# Patient Record
Sex: Female | Born: 1945 | Race: White | Hispanic: No | Marital: Married
Health system: Southern US, Community
[De-identification: ages and names within clinical notes are randomized; demographics above are authoritative.]

## PROBLEM LIST (undated history)

## (undated) HISTORY — PX: ABDOMINAL HYSTERECTOMY: SHX81

## (undated) HISTORY — PX: LAPAROSCOPIC APPENDECTOMY: SHX408

---

## 2005-03-29 ENCOUNTER — Ambulatory Visit: Payer: Self-pay

## 2006-04-15 ENCOUNTER — Ambulatory Visit: Payer: Self-pay | Admitting: Obstetrics and Gynecology

## 2007-06-15 ENCOUNTER — Ambulatory Visit: Payer: Self-pay

## 2007-08-09 ENCOUNTER — Other Ambulatory Visit: Payer: Self-pay

## 2007-08-09 ENCOUNTER — Ambulatory Visit: Payer: Self-pay | Admitting: Emergency Medicine

## 2008-01-19 ENCOUNTER — Ambulatory Visit: Payer: Self-pay | Admitting: Internal Medicine

## 2008-08-08 ENCOUNTER — Ambulatory Visit: Payer: Self-pay

## 2009-04-02 ENCOUNTER — Ambulatory Visit: Payer: Self-pay | Admitting: Internal Medicine

## 2009-08-15 ENCOUNTER — Ambulatory Visit: Payer: Self-pay | Admitting: Family Medicine

## 2009-08-29 ENCOUNTER — Ambulatory Visit: Payer: Self-pay | Admitting: Internal Medicine

## 2009-09-12 ENCOUNTER — Ambulatory Visit: Payer: Self-pay | Admitting: Gastroenterology

## 2010-08-21 ENCOUNTER — Ambulatory Visit: Payer: Self-pay | Admitting: Family Medicine

## 2011-12-10 ENCOUNTER — Ambulatory Visit: Payer: Self-pay | Admitting: Family Medicine

## 2013-01-14 ENCOUNTER — Ambulatory Visit: Payer: Self-pay | Admitting: Family Medicine

## 2014-01-21 ENCOUNTER — Ambulatory Visit: Payer: Self-pay | Admitting: Family Medicine

## 2015-01-24 ENCOUNTER — Ambulatory Visit: Payer: Self-pay | Admitting: Family Medicine

## 2015-07-04 ENCOUNTER — Other Ambulatory Visit: Payer: Self-pay | Admitting: Family Medicine

## 2015-07-04 DIAGNOSIS — M8580 Other specified disorders of bone density and structure, unspecified site: Secondary | ICD-10-CM

## 2016-03-19 ENCOUNTER — Other Ambulatory Visit: Payer: Self-pay | Admitting: Family Medicine

## 2016-03-19 DIAGNOSIS — Z1231 Encounter for screening mammogram for malignant neoplasm of breast: Secondary | ICD-10-CM

## 2016-03-21 ENCOUNTER — Ambulatory Visit
Admission: RE | Admit: 2016-03-21 | Discharge: 2016-03-21 | Disposition: A | Discharge status: Home / Self Care | Payer: Medicare HMO | Source: Ambulatory Visit | Attending: Family Medicine | Admitting: Family Medicine

## 2016-03-21 DIAGNOSIS — M8580 Other specified disorders of bone density and structure, unspecified site: Secondary | ICD-10-CM

## 2016-03-21 DIAGNOSIS — Z1231 Encounter for screening mammogram for malignant neoplasm of breast: Secondary | ICD-10-CM | POA: Insufficient documentation

## 2016-03-21 DIAGNOSIS — M85852 Other specified disorders of bone density and structure, left thigh: Secondary | ICD-10-CM | POA: Diagnosis not present

## 2017-02-14 ENCOUNTER — Other Ambulatory Visit: Payer: Self-pay | Admitting: Family Medicine

## 2017-02-14 DIAGNOSIS — Z1231 Encounter for screening mammogram for malignant neoplasm of breast: Secondary | ICD-10-CM

## 2017-03-25 ENCOUNTER — Ambulatory Visit
Admission: RE | Admit: 2017-03-25 | Discharge: 2017-03-25 | Disposition: A | Discharge status: Home / Self Care | Payer: Medicare HMO | Source: Ambulatory Visit | Attending: Family Medicine | Admitting: Family Medicine

## 2017-03-25 DIAGNOSIS — Z1231 Encounter for screening mammogram for malignant neoplasm of breast: Secondary | ICD-10-CM | POA: Diagnosis present

## 2018-09-30 ENCOUNTER — Other Ambulatory Visit: Payer: Self-pay | Admitting: Family Medicine

## 2018-09-30 DIAGNOSIS — Z1231 Encounter for screening mammogram for malignant neoplasm of breast: Secondary | ICD-10-CM

## 2018-10-07 ENCOUNTER — Ambulatory Visit
Admission: RE | Admit: 2018-10-07 | Discharge: 2018-10-07 | Disposition: A | Discharge status: Home / Self Care | Payer: Medicare HMO | Source: Ambulatory Visit | Attending: Family Medicine | Admitting: Family Medicine

## 2018-10-07 DIAGNOSIS — Z1231 Encounter for screening mammogram for malignant neoplasm of breast: Secondary | ICD-10-CM | POA: Diagnosis not present

## 2019-08-09 ENCOUNTER — Other Ambulatory Visit: Payer: Self-pay | Admitting: Family Medicine

## 2019-08-09 DIAGNOSIS — Z1231 Encounter for screening mammogram for malignant neoplasm of breast: Secondary | ICD-10-CM

## 2019-09-29 ENCOUNTER — Other Ambulatory Visit: Payer: Self-pay | Admitting: Family Medicine

## 2019-09-29 DIAGNOSIS — M8580 Other specified disorders of bone density and structure, unspecified site: Secondary | ICD-10-CM

## 2019-10-11 ENCOUNTER — Ambulatory Visit: Payer: Medicare HMO

## 2019-11-09 ENCOUNTER — Other Ambulatory Visit: Payer: Self-pay

## 2019-11-09 ENCOUNTER — Ambulatory Visit
Admission: RE | Admit: 2019-11-09 | Discharge: 2019-11-09 | Disposition: A | Discharge status: Home / Self Care | Payer: Medicare HMO | Source: Ambulatory Visit | Attending: Family Medicine | Admitting: Family Medicine

## 2019-11-09 ENCOUNTER — Encounter (INDEPENDENT_AMBULATORY_CARE_PROVIDER_SITE_OTHER): Payer: Self-pay

## 2019-11-09 DIAGNOSIS — Z13820 Encounter for screening for osteoporosis: Secondary | ICD-10-CM | POA: Diagnosis present

## 2019-11-09 DIAGNOSIS — Z1231 Encounter for screening mammogram for malignant neoplasm of breast: Secondary | ICD-10-CM | POA: Insufficient documentation

## 2019-11-09 DIAGNOSIS — Z780 Asymptomatic menopausal state: Secondary | ICD-10-CM | POA: Insufficient documentation

## 2019-11-09 DIAGNOSIS — M8580 Other specified disorders of bone density and structure, unspecified site: Secondary | ICD-10-CM | POA: Insufficient documentation

## 2019-11-10 ENCOUNTER — Other Ambulatory Visit: Payer: Self-pay | Admitting: Family Medicine

## 2019-11-10 DIAGNOSIS — R928 Other abnormal and inconclusive findings on diagnostic imaging of breast: Secondary | ICD-10-CM

## 2019-11-10 DIAGNOSIS — R921 Mammographic calcification found on diagnostic imaging of breast: Secondary | ICD-10-CM

## 2019-11-16 ENCOUNTER — Ambulatory Visit
Admission: RE | Admit: 2019-11-16 | Discharge: 2019-11-16 | Disposition: A | Discharge status: Home / Self Care | Payer: Medicare HMO | Source: Ambulatory Visit | Attending: Family Medicine | Admitting: Family Medicine

## 2019-11-16 DIAGNOSIS — R928 Other abnormal and inconclusive findings on diagnostic imaging of breast: Secondary | ICD-10-CM

## 2019-11-16 DIAGNOSIS — R921 Mammographic calcification found on diagnostic imaging of breast: Secondary | ICD-10-CM | POA: Diagnosis present

## 2019-11-23 ENCOUNTER — Other Ambulatory Visit: Payer: Self-pay | Admitting: Family Medicine

## 2019-11-23 DIAGNOSIS — R928 Other abnormal and inconclusive findings on diagnostic imaging of breast: Secondary | ICD-10-CM

## 2019-11-23 DIAGNOSIS — R921 Mammographic calcification found on diagnostic imaging of breast: Secondary | ICD-10-CM

## 2019-11-25 ENCOUNTER — Ambulatory Visit
Admission: RE | Admit: 2019-11-25 | Discharge: 2019-11-25 | Disposition: A | Discharge status: Home / Self Care | Payer: Medicare HMO | Source: Ambulatory Visit | Attending: Family Medicine | Admitting: Family Medicine

## 2019-11-25 DIAGNOSIS — R928 Other abnormal and inconclusive findings on diagnostic imaging of breast: Secondary | ICD-10-CM

## 2019-11-25 DIAGNOSIS — R921 Mammographic calcification found on diagnostic imaging of breast: Secondary | ICD-10-CM | POA: Diagnosis present

## 2019-11-25 HISTORY — PX: BREAST BIOPSY: SHX20

## 2019-11-26 LAB — SURGICAL PATHOLOGY

## 2021-03-19 ENCOUNTER — Other Ambulatory Visit: Payer: Self-pay | Admitting: Family Medicine

## 2021-03-19 DIAGNOSIS — Z1231 Encounter for screening mammogram for malignant neoplasm of breast: Secondary | ICD-10-CM

## 2021-03-28 ENCOUNTER — Other Ambulatory Visit: Payer: Self-pay

## 2021-03-28 ENCOUNTER — Ambulatory Visit
Admission: RE | Admit: 2021-03-28 | Discharge: 2021-03-28 | Disposition: A | Discharge status: Home / Self Care | Payer: Medicare HMO | Source: Ambulatory Visit | Attending: Family Medicine | Admitting: Family Medicine

## 2021-03-28 DIAGNOSIS — Z1231 Encounter for screening mammogram for malignant neoplasm of breast: Secondary | ICD-10-CM | POA: Insufficient documentation

## 2021-04-24 ENCOUNTER — Ambulatory Visit: Payer: Medicare HMO | Attending: Family Medicine | Admitting: Physical Therapy

## 2021-04-24 ENCOUNTER — Other Ambulatory Visit: Payer: Self-pay

## 2021-04-24 ENCOUNTER — Encounter: Payer: Self-pay | Admitting: Physical Therapy

## 2021-04-24 DIAGNOSIS — M6281 Muscle weakness (generalized): Secondary | ICD-10-CM | POA: Insufficient documentation

## 2021-04-24 DIAGNOSIS — R278 Other lack of coordination: Secondary | ICD-10-CM | POA: Diagnosis present

## 2021-04-24 NOTE — Therapy (Signed)
[facility] [address] [place], [place], [address] Phone: [phone]   Fax:  [phone]  Physical Therapy Evaluation  Patient Details  Name: Tammie Phillips MRN: [id number] Date of Birth: 08/21/1946 Referring Provider (PT): Gayland Curry   Encounter Date: 04/24/2021   PT End of Session - 04/24/21 1821    Visit Number 1    Number of Visits 12    Date for PT Re-Evaluation 07/17/21    Authorization Type IE 04/24/2021    PT Start Time 0800    PT Stop Time 0845    PT Time Calculation (min) 45 min    Activity Tolerance Patient tolerated treatment well    Behavior During Therapy [facility] for tasks assessed/performed           History reviewed. No pertinent past medical history.  Past Surgical History:  Procedure Laterality Date  . ABDOMINAL HYSTERECTOMY     partial  . BREAST BIOPSY Right 11/25/2019   calcs, x marker,neg  . BREAST BIOPSY Right 11/25/2019   calcs, ribbon marker, neg  . LAPAROSCOPIC APPENDECTOMY      There were no vitals filed for this visit.        [facility] PT Assessment - 04/24/21 0001      Assessment   Medical Diagnosis pelvic floor weakness    Referring Provider (PT) Gayland Curry    Hand Dominance Right    Prior Therapy None for this dx      Balance Screen   Has the patient fallen in the past 6 months No           PELVIC HEALTH PHYSICAL THERAPY EVALUATION  SCREENING Red Flags: None Have you had any night sweats? Unexplained weight loss? Saddle anesthesia? Unexplained changes in bowel or bladder habits?  Precautions: Osteopenia   SUBJECTIVE  Chief Complaint: Patient reports that about a year ago she was placed on calcium for osteopenia which caused a lot of disruption to GI system. Patient has leakage at this time. Patient has family history of colon cancer and follows with GI; GI told patient about weak anal muscles. Patient notes that she does have internal and external  hemmorhoids which contribute to her concerns for accidental leakage as she deals not only with stool but also with blood. 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   Patient denies bladder leakage; patient notes that she feels tissue at the entrance when performing hygiene. 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  Pertinent History:  Falls Negative.  Scoliosis Negative. Pulmonary disease/dysfunction Negative. Surgical history: Positive for see above.   Recent Procedures/Tests/Findings: None  Obstetrical History: G3P1 Deliveries: vaginal Tearing/Episiotomy: unsure; no recollection Birthing position: back  Gynecological History: Hysterectomy: Yes Abdominal Pelvic Organ Prolapse: Positive for suspicious. Pain with exam: No Heaviness/pressure: No  Urinary History: Incontinence: Positive for history. Onset: during working years. Triggers: jumping. Amount: Min/Mod.  Protective undergarments: No  Fluid Intake: 60 oz H20, 2 cups coffee; 1 cup tea caffeinated Nocturia: 0x/night Frequency of urination: every 2-4 hours Pain with urination: Negative Difficulty initiating urination: Negative Intermittent stream: Positive for rarely Frequent UTI: Positive for historically.   Gastrointestinal History: [place] Stool Chart: Type 1 (20%), Type 3-4 (75%), Type 6-7 (5%) Frequency of BMs: largely variable; sometimes every third day; sometimes multiple times/day Pain with defecation: Positive for hemorrhoidal pain. Straining with defecation: Negative Hemorrhoids:  Positive ; internal and external; active Toileting posture: not currently using  Incontinence: Positive for gas, stool, mucous. Onset: 1-2 years Triggers: standing, sitting, post-defecation. Amount: Min/Mod.   Sexual activity/pain: Pain with intercourse: Negative.   Initial penetration:  No  Deep thrusting: No External stimulation: No   Location of pain: LLQ (relieved by emptying bowels) Current pain:  0/10  Max pain:  5/10 Least pain:  0/10 Pain quality: pain quality: sharp Radiating pain: No    Patient assessment of present state: "weak muscles"  Current activities:  Golfing, hiking, tai chi  Patient Goals:  "Build up pelvic floor strength and see if this can be helped"  OBJECTIVE  Mental Status Patient is oriented to person, place and time.  Recent memory is intact.  Remote memory is intact.  Attention span and concentration are intact.  Expressive speech is intact.  Patient's fund of knowledge is within normal limits for educational level.  POSTURE/OBSERVATIONS:  Lumbar lordosis: appearing WNL Thoracic kyphosis: mildly increased in angle and length Iliac crest height: appearing equal bilaterally Pelvic obliquity: appearing negative Leg length discrepancy: appearing negative  GAIT: Grossly WFL  RANGE OF MOTION: deferred 2/2 to time constraints   LEFT RIGHT  Lumbar forward flexion (65):      Lumbar extension (30):     Lumbar lateral flexion (25):     Thoracic and Lumbar rotation (30 degrees):       Hip Flexion (0-125):      Hip IR (0-45):     Hip ER (0-45):     Hip Abduction (0-40):     Hip extension (0-15):       STRENGTH: MMT deferred 2/2 to time constraints  RLE LLE  Hip Flexion    Hip Extension    Hip Abduction     Hip Adduction     Hip ER     Hip IR     Knee Extension    Knee Flexion    Dorsiflexion     Plantarflexion (seated)     ABDOMINAL: deferred 2/2 to time constraints Palpation: Diastasis: Scar mobility: Rib flare:  SPECIAL TESTS: deferred 2/2 to time constraints  PHYSICAL PERFORMANCE MEASURES: STS: WFL  EXTERNAL PELVIC EXAM: deferred 2/2 to time constraints Palpation: Breath coordination: Cued Lengthen: Cued Contraction: Cough:  INTERNAL VAGINAL EXAM: deferred 2/2 to time constraints Introitus Appears:   Skin integrity:  Scar mobility: Strength (PERF):  Symmetry: Palpation: Prolapse:   INTERNAL RECTAL EXAM: deferred 2/2 to time constraints Strength (PERF): Symmetry: Palpation: Prolapse:   OUTCOME MEASURES: FOTO (Bowel Leakage 65)   ASSESSMENT Patient is a 75 year old presenting to clinic with chief complaints of accidental bowel leakage and heaviness/pressure in the vagina. Evaluation today is suggestive of deficits in PFM strength, PFM coordination, and IAP management as evidenced by LLQ pain 5/10 with presence of stool, report of abnormal tissue at vaginal opening, gas/stool/mucous leakage with transfers and increased activity. Patient's responses on FOTO outcome measures (57) indicate significant functional limitations/disability/distress. Patient's progress may be limited due to age-related sarcopenia impacts on PFM; however, patient's motivation is advantageous. Patient was able to achieve basic understanding of PFM functions during today's evaluation and responded positively to educational interventions. Patient will benefit from continued skilled therapeutic intervention to address deficits in PFM strength, PFM coordination, and IAP management in order to increase function and improve overall QOL.  EDUCATION Patient educated on prognosis, POC, and provided with HEP including: bowel diary, toileting posture. Patient articulated understanding and returned demonstration. Patient will benefit  from further education in order to maximize compliance and understanding for long-term therapeutic gains.      Objective measurements completed on examination: See above findings.       PT Long Term Goals - 04/24/21 1840      PT LONG TERM GOAL #1   Title Patient will demonstrate independence with HEP in order to maximize therapeutic gains and improve carryover from physical therapy sessions to ADLs in the home and community.    Baseline IE: not initiated    Time 12    Period Weeks     Status New    Target Date 07/17/21      PT LONG TERM GOAL #2   Title Patient will report BMs classified as Type 3-Type 4 on the [facility] Stool Chart greater than 90% of the time to demonstrate improved motility and stool bulking in order to decrease fecal distress and improve overall QOL.    Baseline IE: Type 3-4: 75%    Time 12    Period Weeks    Status New    Target Date 07/17/21      PT LONG TERM GOAL #3   Title Patient will demonstrate improved function as evidenced by a score of 68 on FOTO measure for full participation in activities at home and in the community.    Baseline IE: 31    Time 12    Period Weeks    Status New    Target Date 07/17/21      PT LONG TERM GOAL #4   Title Patient will be able to articulate and demonstrate appropriate body mechanics for management of intra-abdominal pressure as well as bone density precautions for improved function/participation at home and in the community.    Baseline IE: not demonstrated    Time 12    Period Weeks    Status New    Target Date 07/17/21      PT LONG TERM GOAL #5   Title Patient will demonstrate circumferential and sequential contraction of >4/5 MMT, > 5 sec hold x5 and 5 consecutive quick flicks with </= 10 min rest between testing bouts, and relaxation of the PFM coordinated with breath for improved management of intra-abdominal pressure and normal bowel and bladder function without the presence of pain nor incontinence in order to improve participation at home and in the community.    Baseline IE: to be assessed at next visit    Time 12    Period Weeks    Status New    Target Date 07/17/21      Additional Long Term Goals   Additional Long Term Goals Yes      PT LONG TERM GOAL #6   Title Patient will demonstrate coordinated lengthening and relaxation of PFM with diaphragmatic inhalation in order to decrease spasm and allow for unrestricted elimination of urine/feces for improved overall QOL.    Baseline IE: to be  assessed at next visit    Time 12    Period Weeks    Status New    Target Date 07/17/21                  Plan - 04/24/21 1822    Clinical Impression Statement Patient is a 75 year old presenting to clinic with chief complaints of accidental bowel leakage and heaviness/pressure in the vagina. Evaluation today is suggestive of deficits in PFM strength, PFM coordination, and IAP management as evidenced by LLQ pain 5/10 with presence of stool, report of  abnormal tissue at vaginal opening, gas/stool/mucous leakage with transfers and increased activity. Patient's responses on FOTO outcome measures (57) indicate significant functional limitations/disability/distress. Patient's progress may be limited due to age-related sarcopenia impacts on PFM; however, patient's motivation is advantageous. Patient was able to achieve basic understanding of PFM functions during today's evaluation and responded positively to educational interventions. Patient will benefit from continued skilled therapeutic intervention to address deficits in PFM strength, PFM coordination, and IAP management in order to increase function and improve overall QOL.    Personal Factors and Comorbidities Comorbidity 3+;Past/Current Experience;Time since onset of injury/illness/exacerbation;Behavior Pattern;Age    Comorbidities hyperlipidemia, CAD, HTN, atherosclerosis of abdominal aorta    Examination-Activity Limitations Transfers;Continence;Squat;Lift;Bend;Stand    Examination-Participation Restrictions Community Activity;Shop;Cleaning;Laundry;Yard Work;Driving    Stability/Clinical Decision Making Evolving/Moderate complexity    Clinical Decision Making Moderate    Rehab Potential Fair    PT Frequency 1x / week    PT Duration 12 weeks    PT Treatment/Interventions ADLs/Self Care Home Management;Biofeedback;Cryotherapy;Electrical Stimulation;Moist Heat;Therapeutic exercise;Neuromuscular re-education;Patient/family education;Manual  techniques;Scar mobilization;Dry needling;Taping    PT Next Visit Plan physical assessment    PT Home Exercise Plan bowel diary    Consulted and Agree with Plan of Care Patient           Patient will benefit from skilled therapeutic intervention in order to improve the following deficits and impairments:  Improper body mechanics,Decreased coordination,Decreased activity tolerance,Decreased strength  Visit Diagnosis: Other lack of coordination  Muscle weakness (generalized)     Problem List There are no problems to display for this patient.  Myles Gip PT, DPT [phone]  04/24/2021, 6:44 PM  [facility] [address] [place], [place], [address] Phone: [phone]   Fax:  [phone]  Name: Tammie Phillips MRN: [id number] Date of Birth: 06-Jun-1946

## 2021-04-30 ENCOUNTER — Ambulatory Visit: Payer: Medicare HMO | Admitting: Physical Therapy

## 2021-04-30 ENCOUNTER — Encounter: Payer: Self-pay | Admitting: Physical Therapy

## 2021-04-30 ENCOUNTER — Other Ambulatory Visit: Payer: Self-pay

## 2021-04-30 DIAGNOSIS — R278 Other lack of coordination: Secondary | ICD-10-CM | POA: Diagnosis not present

## 2021-04-30 DIAGNOSIS — M6281 Muscle weakness (generalized): Secondary | ICD-10-CM

## 2021-04-30 NOTE — Therapy (Signed)
[place] [facility] [address] [place], [place], [address] Phone: [phone]   Fax:  [phone]  Physical Therapy Treatment  Patient Details  Name: Tammie Phillips MRN: [id number] Date of Birth: [birth year] Referring Provider (PT): Leim Fabry   Encounter Date: 04/30/2021   PT End of Session - 04/30/21 0836    Visit Number 2    Number of Visits 12    Date for PT Re-Evaluation 07/17/21    Authorization Type IE 04/24/2021    PT Start Time 0840    PT Stop Time 0925    PT Time Calculation (min) 45 min    Activity Tolerance Patient tolerated treatment well    Behavior During Therapy [facility] for tasks assessed/performed           History reviewed. No pertinent past medical history.  Past Surgical History:  Procedure Laterality Date  . ABDOMINAL HYSTERECTOMY     partial  . BREAST BIOPSY Right 11/25/2019   calcs, x marker,neg  . BREAST BIOPSY Right 11/25/2019   calcs, ribbon marker, neg  . LAPAROSCOPIC APPENDECTOMY      There were no vitals filed for this visit.   Subjective Assessment - 04/30/21 0841    Subjective Patient notes that nothing much has changed. Patient reports that she had a pretty good week with respect to BMs. States that she had one episode of smearing and one episode of accidental bowel leakage.    Currently in Pain? No/denies          TREATMENT  Pre-treatment assessment: RANGE OF MOTION:    LEFT RIGHT  Lumbar forward flexion (65):  WFL    Lumbar extension (30): WFL    Lumbar lateral flexion (25):  [facility]  Thoracic and Lumbar rotation (30 degrees):    [facility] WFL  Hip Flexion (0-125):   WNL WNL  Hip IR (0-45):  WNL WNL  Hip ER (0-45):  WNL WNL  Hip Abduction (0-40):  WNL WNL  Hip extension (0-15):  WNL WNL    STRENGTH: MMT   RLE LLE  Hip Flexion 4 3+*  Hip Extension 5 5  Hip Abduction  5 5  Hip Adduction  5 5  Hip ER  4 3+  Hip IR  5 5  Knee Extension 5 5  Knee Flexion 5 5  Dorsiflexion   5 5  Plantarflexion (seated) 5 5   ABDOMINAL:  Palpation: TTP over midline in UQ Diastasis: none present Scar mobility: n/a Rib flare: B, L>R  SPECIAL TESTS: SLR (SN 92, -LR 0.29): R: Negative L:  Negative FABER (SN 81): R: Negative L: Negative FADIR (SN 94): R: Negative L: Negative Hip scour (SN 50): R: Negative L: Negative   EXTERNAL PELVIC EXAM: Patient educated on the purpose of the pelvic exam and articulated understanding; patient consented to the exam verbally. Palpation: no TTP Breath coordination: minimally present Cued Lengthen: able to coordinate with eccentric abdominal synergy Cued Contraction: significant contributions from abdominals, gluteals and adductors Cough: paradoxical  Neuromuscular Re-education: Supine hooklying diaphragmatic breathing with VCs and TCs for downregulation of the nervous system and improved management of IAP Supine hooklying, PFM lengthening with inhalation. VCs and TCs to decrease compensatory patterns and encourage optimal relaxation of the PFM. Patient education on genital hygiene in the presence of bowel leakage, provided with handouts.   Patient educated throughout session on appropriate technique and form using multi-modal cueing, HEP, and activity modification. Patient articulated understanding and returned demonstration.  Patient  Response to interventions: Comfortable to return in 2 weeks for PFM basics and bowel function education  ASSESSMENT Patient presents to clinic with excellent motivation to participate in therapy. Patient demonstrates deficits in PFM strength, PFM coordination, and IAP management. Patient able to achieve PFM lengthening with coordinated breath during today's session and responded positively to neuromuscular re-education interventions. Patient will benefit from continued skilled therapeutic intervention to address remaining deficits in PFM strength, PFM coordination, and IAP management in order to increase  function and improve overall QOL.     PT Long Term Goals - 04/24/21 1840      PT LONG TERM GOAL #1   Title Patient will demonstrate independence with HEP in order to maximize therapeutic gains and improve carryover from physical therapy sessions to ADLs in the home and community.    Baseline IE: not initiated    Time 12    Period Weeks    Status New    Target Date 07/17/21      PT LONG TERM GOAL #2   Title Patient will report BMs classified as Type 3-Type 4 on the [facility] Stool Chart greater than 90% of the time to demonstrate improved motility and stool bulking in order to decrease fecal distress and improve overall QOL.    Baseline IE: Type 3-4: 75%    Time 12    Period Weeks    Status New    Target Date 07/17/21      PT LONG TERM GOAL #3   Title Patient will demonstrate improved function as evidenced by a score of 68 on FOTO measure for full participation in activities at home and in the community.    Baseline IE: 57    Time 12    Period Weeks    Status New    Target Date 07/17/21      PT LONG TERM GOAL #4   Title Patient will be able to articulate and demonstrate appropriate body mechanics for management of intra-abdominal pressure as well as bone density precautions for improved function/participation at home and in the community.    Baseline IE: not demonstrated    Time 12    Period Weeks    Status New    Target Date 07/17/21      PT LONG TERM GOAL #5   Title Patient will demonstrate circumferential and sequential contraction of >4/5 MMT, > 5 sec hold x5 and 5 consecutive quick flicks with </= 10 min rest between testing bouts, and relaxation of the PFM coordinated with breath for improved management of intra-abdominal pressure and normal bowel and bladder function without the presence of pain nor incontinence in order to improve participation at home and in the community.    Baseline IE: to be assessed at next visit    Time 12    Period Weeks    Status New    Target  Date 07/17/21      Additional Long Term Goals   Additional Long Term Goals Yes      PT LONG TERM GOAL #6   Title Patient will demonstrate coordinated lengthening and relaxation of PFM with diaphragmatic inhalation in order to decrease spasm and allow for unrestricted elimination of urine/feces for improved overall QOL.    Baseline IE: to be assessed at next visit    Time 12    Period Weeks    Status New    Target Date 07/17/21  Plan - 04/30/21 0837    Clinical Impression Statement Patient presents to clinic with excellent motivation to participate in therapy. Patient demonstrates deficits in PFM strength, PFM coordination, and IAP management. Patient able to achieve PFM lengthening with coordinated breath during today's session and responded positively to neuromuscular re-education interventions. Patient will benefit from continued skilled therapeutic intervention to address remaining deficits in PFM strength, PFM coordination, and IAP management in order to increase function and improve overall QOL.    Personal Factors and Comorbidities Comorbidity 3+;Past/Current Experience;Time since onset of injury/illness/exacerbation;Behavior Pattern;Age    Comorbidities hyperlipidemia, CAD, HTN, atherosclerosis of abdominal aorta    Examination-Activity Limitations Transfers;Continence;Squat;Lift;Bend;Stand    Examination-Participation Restrictions Community Activity;Shop;Cleaning;Laundry;Yard Work;Driving    Stability/Clinical Decision Making Evolving/Moderate complexity    Rehab Potential Fair    PT Frequency 1x / week    PT Duration 12 weeks    PT Treatment/Interventions ADLs/Self Care Home Management;Biofeedback;Cryotherapy;Electrical Stimulation;Moist Heat;Therapeutic exercise;Neuromuscular re-education;Patient/family education;Manual techniques;Scar mobilization;Dry needling;Taping    PT Next Visit Plan defecation basics    PT Home Exercise Plan bowel diary    Consulted  and Agree with Plan of Care Patient           Patient will benefit from skilled therapeutic intervention in order to improve the following deficits and impairments:  Improper body mechanics,Decreased coordination,Decreased activity tolerance,Decreased strength  Visit Diagnosis: Other lack of coordination  Muscle weakness (generalized)     Problem List There are no problems to display for this patient.  Sheria Lang PT, DPT [phone]  04/30/2021, 9:41 AM  [facility] [address] [place], [place], [address] Phone: [phone]   Fax:  [phone]  Name: Tammie Phillips MRN: [id number] Date of Birth: 1946/03/25

## 2021-05-01 ENCOUNTER — Ambulatory Visit: Payer: Medicare HMO | Admitting: Physical Therapy

## 2021-05-14 ENCOUNTER — Other Ambulatory Visit: Payer: Self-pay

## 2021-05-14 ENCOUNTER — Ambulatory Visit: Payer: Medicare HMO | Admitting: Physical Therapy

## 2021-05-14 ENCOUNTER — Encounter: Payer: Self-pay | Admitting: Physical Therapy

## 2021-05-14 DIAGNOSIS — R278 Other lack of coordination: Secondary | ICD-10-CM

## 2021-05-14 DIAGNOSIS — M6281 Muscle weakness (generalized): Secondary | ICD-10-CM

## 2021-05-14 NOTE — Therapy (Signed)
[facility] [address] [place], [place], [address] Phone: [phone]   Fax:  [phone]  Physical Therapy Treatment  Patient Details  Name: Tammie Phillips MRN: [id number] Date of Birth: [birth year] Referring Provider (PT): Leim Fabry   Encounter Date: 05/14/2021   PT End of Session - 05/14/21 0811    Visit Number 3    Number of Visits 12    Date for PT Re-Evaluation 07/17/21    Authorization Type IE 04/24/2021    PT Start Time 0800    PT Stop Time 0840    PT Time Calculation (min) 40 min    Activity Tolerance Patient tolerated treatment well    Behavior During Therapy [facility] for tasks assessed/performed           History reviewed. No pertinent past medical history.  Past Surgical History:  Procedure Laterality Date  . ABDOMINAL HYSTERECTOMY     partial  . BREAST BIOPSY Right 11/25/2019   calcs, x marker,neg  . BREAST BIOPSY Right 11/25/2019   calcs, ribbon marker, neg  . LAPAROSCOPIC APPENDECTOMY      There were no vitals filed for this visit.   Subjective Assessment - 05/14/21 0802    Subjective Patient reports that after last session, she had a good week with minimal leakage. Patient did go to the beach for a week and had some changes in diet as a result. Patient had diarrhea and had minimal leakage; patient then had complete loss of bowel. Patient also had loss of bowel when leaving the house.    Currently in Pain? No/denies             TREATMENT Manual Therapy: Colon massage for improved regulation of bowel and decreased pain over LLQ. Patient articulated understanding and demonstrated technique. MFR of LLQ with superior medial mobilization of sigmoid colon for improved bowel emptying and pain management.   Neuromuscular Re-education: Supine hooklying diaphragmatic breathing with VCs and TCs for downregulation of the nervous system and improved management of IAP Patient educated on typical bowel  function, factors impacting bowel function including: dietary and hormonal. Standing hip flexor stretch for decreased pain and tension in LQ.  Patient educated throughout session on appropriate technique and form using multi-modal cueing, HEP, and activity modification. Patient articulated understanding and returned demonstration.  Patient Response to interventions: Comfortable to apply stretch and massage techniques at home.  ASSESSMENT Patient presents to clinic with excellent motivation to participate in therapy. Patient demonstrates deficits in PFM strength, PFM coordination, and IAP management. Patient had initial LLQ tenderness which improved with manual techniques during today's session and responded positively to neuromuscular re-education interventions. Patient will benefit from continued skilled therapeutic intervention to address remaining deficits in PFM strength, PFM coordination, and IAP management in order to increase function and improve overall QOL.     PT Long Term Goals - 04/24/21 1840      PT LONG TERM GOAL #1   Title Patient will demonstrate independence with HEP in order to maximize therapeutic gains and improve carryover from physical therapy sessions to ADLs in the home and community.    Baseline IE: not initiated    Time 12    Period Weeks    Status New    Target Date 07/17/21      PT LONG TERM GOAL #2   Title Patient will report BMs classified as Type 3-Type 4 on the [facility] Stool Chart greater than 90% of the time to demonstrate improved  motility and stool bulking in order to decrease fecal distress and improve overall QOL.    Baseline IE: Type 3-4: 75%    Time 12    Period Weeks    Status New    Target Date 07/17/21      PT LONG TERM GOAL #3   Title Patient will demonstrate improved function as evidenced by a score of 68 on FOTO measure for full participation in activities at home and in the community.    Baseline IE: 57    Time 12    Period Weeks     Status New    Target Date 07/17/21      PT LONG TERM GOAL #4   Title Patient will be able to articulate and demonstrate appropriate body mechanics for management of intra-abdominal pressure as well as bone density precautions for improved function/participation at home and in the community.    Baseline IE: not demonstrated    Time 12    Period Weeks    Status New    Target Date 07/17/21      PT LONG TERM GOAL #5   Title Patient will demonstrate circumferential and sequential contraction of >4/5 MMT, > 5 sec hold x5 and 5 consecutive quick flicks with </= 10 min rest between testing bouts, and relaxation of the PFM coordinated with breath for improved management of intra-abdominal pressure and normal bowel and bladder function without the presence of pain nor incontinence in order to improve participation at home and in the community.    Baseline IE: to be assessed at next visit    Time 12    Period Weeks    Status New    Target Date 07/17/21      Additional Long Term Goals   Additional Long Term Goals Yes      PT LONG TERM GOAL #6   Title Patient will demonstrate coordinated lengthening and relaxation of PFM with diaphragmatic inhalation in order to decrease spasm and allow for unrestricted elimination of urine/feces for improved overall QOL.    Baseline IE: to be assessed at next visit    Time 12    Period Weeks    Status New    Target Date 07/17/21                 Plan - 05/14/21 5625    Clinical Impression Statement Patient presents to clinic with excellent motivation to participate in therapy. Patient demonstrates deficits in PFM strength, PFM coordination, and IAP management. Patient had initial LLQ tenderness which improved with manual techniques during today's session and responded positively to neuromuscular re-education interventions. Patient will benefit from continued skilled therapeutic intervention to address remaining deficits in PFM strength, PFM coordination,  and IAP management in order to increase function and improve overall QOL.    Personal Factors and Comorbidities Comorbidity 3+;Past/Current Experience;Time since onset of injury/illness/exacerbation;Behavior Pattern;Age    Comorbidities hyperlipidemia, CAD, HTN, atherosclerosis of abdominal aorta    Examination-Activity Limitations Transfers;Continence;Squat;Lift;Bend;Stand    Examination-Participation Restrictions Community Activity;Shop;Cleaning;Laundry;Yard Work;Driving    Stability/Clinical Decision Making Evolving/Moderate complexity    Rehab Potential Fair    PT Frequency 1x / week    PT Duration 12 weeks    PT Treatment/Interventions ADLs/Self Care Home Management;Biofeedback;Cryotherapy;Electrical Stimulation;Moist Heat;Therapeutic exercise;Neuromuscular re-education;Patient/family education;Manual techniques;Scar mobilization;Dry needling;Taping    PT Next Visit Plan sacral mobilizations; PFM strengthening, diaphragmatic release    PT Home Exercise Plan hip flexor stretch, colon massage    Consulted and Agree with Plan of  Care Patient           Patient will benefit from skilled therapeutic intervention in order to improve the following deficits and impairments:  Improper body mechanics,Decreased coordination,Decreased activity tolerance,Decreased strength  Visit Diagnosis: Other lack of coordination  Muscle weakness (generalized)     Problem List There are no problems to display for this patient.  Sheria Lang PT, DPT [phone]  05/14/2021, 9:17 AM  [facility] [address] [place], [place], [address] Phone: [phone]   Fax:  [phone]  Name: Tammie Phillips MRN: [id number] Date of Birth: 09/25/1946

## 2021-05-15 ENCOUNTER — Encounter: Payer: Medicare HMO | Admitting: Physical Therapy

## 2021-05-16 ENCOUNTER — Encounter: Payer: Medicare HMO | Admitting: Physical Therapy

## 2021-05-22 ENCOUNTER — Encounter: Payer: Medicare HMO | Admitting: Physical Therapy

## 2021-05-23 ENCOUNTER — Encounter: Payer: Medicare HMO | Admitting: Physical Therapy

## 2021-05-24 ENCOUNTER — Ambulatory Visit: Payer: Medicare HMO | Attending: Family Medicine | Admitting: Physical Therapy

## 2021-05-24 ENCOUNTER — Encounter: Payer: Self-pay | Admitting: Physical Therapy

## 2021-05-24 ENCOUNTER — Other Ambulatory Visit: Payer: Self-pay

## 2021-05-24 DIAGNOSIS — M6281 Muscle weakness (generalized): Secondary | ICD-10-CM | POA: Insufficient documentation

## 2021-05-24 DIAGNOSIS — R278 Other lack of coordination: Secondary | ICD-10-CM | POA: Diagnosis not present

## 2021-05-24 NOTE — Therapy (Signed)
[facility] [address] [place], [place], [address] Phone: [phone]   Fax:  [phone]  Physical Therapy Treatment  Patient Details  Name: Tammie Phillips MRN: [id number] Date of Birth: 10/11/1946 Referring Provider (PT): Leim Fabry   Encounter Date: 05/24/2021   PT End of Session - 05/24/21 0907    Visit Number 4    Number of Visits 12    Date for PT Re-Evaluation 07/17/21    Authorization Type IE 04/24/2021    PT Start Time 0845    PT Stop Time 0925    PT Time Calculation (min) 40 min    Activity Tolerance Patient tolerated treatment well    Behavior During Therapy [facility] for tasks assessed/performed           History reviewed. No pertinent past medical history.  Past Surgical History:  Procedure Laterality Date  . ABDOMINAL HYSTERECTOMY     partial  . BREAST BIOPSY Right 11/25/2019   calcs, x marker,neg  . BREAST BIOPSY Right 11/25/2019   calcs, ribbon marker, neg  . LAPAROSCOPIC APPENDECTOMY      There were no vitals filed for this visit.   Subjective Assessment - 05/24/21 0847    Subjective Patient notes since last visit, she has had a very calm week with respect to digestive issues. Patient notes that she has gotten her bidet but will be setting it up soon. She has had only 1-2x with some seepage on undergarments. Patient notes yesterday she had some pain in the LLQ but this resolved prior to bed. Patient also reports that she has noticed with first BM in the AM she is having to go within 1-2 hours later.    Currently in Pain? No/denies           TREATMENT Manual Therapy: STM and TPR performed to L gluteal mm and sacral connective tissues to allow for decreased tension and pain and improved posture and function L sacral border mobilizations with movement for decreased spasm and improved mobility, grade II/III   Neuromuscular Re-education: Patient educated on resources for information regarding  GI symptoms and management including: [employer] and [place] Criteria.  Figure 4 stretch, L, for improved pain and lumbopelvic posture  Patient educated throughout session on appropriate technique and form using multi-modal cueing, HEP, and activity modification. Patient articulated understanding and returned demonstration.  Patient Response to interventions: Comfortable to return in 1 week  ASSESSMENT Patient presents to clinic with excellent motivation to participate in therapy. Patient demonstrates deficits in PFM strength, PFM coordination, and IAP management. Patient with significant hypomobility at L sacral border and L sacral ligaments during today's session and responded positively to manual interventions. Patient will benefit from continued skilled therapeutic intervention to address remaining deficits in PFM strength, PFM coordination, and IAP management in order to increase function and improve overall QOL.    PT Long Term Goals - 04/24/21 1840      PT LONG TERM GOAL #1   Title Patient will demonstrate independence with HEP in order to maximize therapeutic gains and improve carryover from physical therapy sessions to ADLs in the home and community.    Baseline IE: not initiated    Time 12    Period Weeks    Status New    Target Date 07/17/21      PT LONG TERM GOAL #2   Title Patient will report BMs classified as Type 3-Type 4 on the [facility] Stool Chart greater than 90%  of the time to demonstrate improved motility and stool bulking in order to decrease fecal distress and improve overall QOL.    Baseline IE: Type 3-4: 75%    Time 12    Period Weeks    Status New    Target Date 07/17/21      PT LONG TERM GOAL #3   Title Patient will demonstrate improved function as evidenced by a score of 68 on FOTO measure for full participation in activities at home and in the community.    Baseline IE: 57    Time 12    Period Weeks    Status New    Target Date 07/17/21      PT  LONG TERM GOAL #4   Title Patient will be able to articulate and demonstrate appropriate body mechanics for management of intra-abdominal pressure as well as bone density precautions for improved function/participation at home and in the community.    Baseline IE: not demonstrated    Time 12    Period Weeks    Status New    Target Date 07/17/21      PT LONG TERM GOAL #5   Title Patient will demonstrate circumferential and sequential contraction of >4/5 MMT, > 5 sec hold x5 and 5 consecutive quick flicks with </= 10 min rest between testing bouts, and relaxation of the PFM coordinated with breath for improved management of intra-abdominal pressure and normal bowel and bladder function without the presence of pain nor incontinence in order to improve participation at home and in the community.    Baseline IE: to be assessed at next visit    Time 12    Period Weeks    Status New    Target Date 07/17/21      Additional Long Term Goals   Additional Long Term Goals Yes      PT LONG TERM GOAL #6   Title Patient will demonstrate coordinated lengthening and relaxation of PFM with diaphragmatic inhalation in order to decrease spasm and allow for unrestricted elimination of urine/feces for improved overall QOL.    Baseline IE: to be assessed at next visit    Time 12    Period Weeks    Status New    Target Date 07/17/21                 Plan - 05/24/21 0907    Clinical Impression Statement Patient presents to clinic with excellent motivation to participate in therapy. Patient demonstrates deficits in PFM strength, PFM coordination, and IAP management. Patient with significant hypomobility at L sacral border and L sacral ligaments during today's session and responded positively to manual interventions. Patient will benefit from continued skilled therapeutic intervention to address remaining deficits in PFM strength, PFM coordination, and IAP management in order to increase function and improve  overall QOL.    Personal Factors and Comorbidities Comorbidity 3+;Past/Current Experience;Time since onset of injury/illness/exacerbation;Behavior Pattern;Age    Comorbidities hyperlipidemia, CAD, HTN, atherosclerosis of abdominal aorta    Examination-Activity Limitations Transfers;Continence;Squat;Lift;Bend;Stand    Examination-Participation Restrictions Community Activity;Shop;Cleaning;Laundry;Yard Work;Driving    Stability/Clinical Decision Making Evolving/Moderate complexity    Rehab Potential Fair    PT Frequency 1x / week    PT Duration 12 weeks    PT Treatment/Interventions ADLs/Self Care Home Management;Biofeedback;Cryotherapy;Electrical Stimulation;Moist Heat;Therapeutic exercise;Neuromuscular re-education;Patient/family education;Manual techniques;Scar mobilization;Dry needling;Taping    PT Next Visit Plan sacral mobilizations; PFM strengthening, diaphragmatic release    PT Home Exercise Plan hip flexor stretch, colon massage  Consulted and Agree with Plan of Care Patient           Patient will benefit from skilled therapeutic intervention in order to improve the following deficits and impairments:  Improper body mechanics,Decreased coordination,Decreased activity tolerance,Decreased strength  Visit Diagnosis: Other lack of coordination  Muscle weakness (generalized)     Problem List There are no problems to display for this patient.  Sheria Lang PT, DPT [phone]  05/24/2021, 1:03 PM  [place] [facility] [address] [place], [place], [address] Phone: [phone]   Fax:  [phone]  Name: Caleen Taaffe MRN: [id number] Date of Birth: 1946/09/14

## 2021-05-28 ENCOUNTER — Encounter: Payer: Self-pay | Admitting: Physical Therapy

## 2021-05-28 ENCOUNTER — Ambulatory Visit: Payer: Medicare HMO | Admitting: Physical Therapy

## 2021-05-28 ENCOUNTER — Other Ambulatory Visit: Payer: Self-pay

## 2021-05-28 DIAGNOSIS — R278 Other lack of coordination: Secondary | ICD-10-CM

## 2021-05-28 DIAGNOSIS — M6281 Muscle weakness (generalized): Secondary | ICD-10-CM

## 2021-05-28 NOTE — Therapy (Signed)
[facility] [facility] [address] [place], [place], [address] Phone: [phone]   Fax:  [phone]  Physical Therapy Treatment  Patient Details  Name: Tammie Phillips MRN: [id number] Date of Birth: [birth year] Referring Provider (PT): Leim Fabry   Encounter Date: 05/28/2021   PT End of Session - 05/28/21 0810    Visit Number 5    Number of Visits 12    Date for PT Re-Evaluation 07/17/21    Authorization Type IE 04/24/2021    PT Start Time 0800    PT Stop Time 0840    PT Time Calculation (min) 40 min    Activity Tolerance Patient tolerated treatment well    Behavior During Therapy [facility] for tasks assessed/performed           History reviewed. No pertinent past medical history.  Past Surgical History:  Procedure Laterality Date  . ABDOMINAL HYSTERECTOMY     partial  . BREAST BIOPSY Right 11/25/2019   calcs, x marker,neg  . BREAST BIOPSY Right 11/25/2019   calcs, ribbon marker, neg  . LAPAROSCOPIC APPENDECTOMY      There were no vitals filed for this visit.   Subjective Assessment - 05/28/21 0807    Subjective Patient notes no changes since last visit. Patient will be installing bidet this week. Patient does note some pain yesterday in colon and has correlated this with garlic consumption.    Currently in Pain? No/denies           TREATMENT Manual Therapy: STM and TPR performed to L gluteal mm and sacral connective tissues to allow for decreased tension and pain and improved posture and function L sacral border mobilizations with movement for decreased spasm and improved mobility, grade II/III Diaphragmatic MFR for improved rib cage mobility and bowel function  Neuromuscular Re-education: Cat/Cow with coordinated breath pattern for improved spinal mobility Supine hooklying diaphragmatic breathing with VCs and TCs for downregulation of the nervous system and improved management of IAP Sidelying, thoracolumbar  rotations (open-book) bilaterally with diaphragmatic breathing for improved diaphragmatic and rib cage excursion. VCs and TCs to prevent compensations. Seated cat/cow with coordinated breath pattern for improved spinal mobility   Patient educated throughout session on appropriate technique and form using multi-modal cueing, HEP, and activity modification. Patient articulated understanding and returned demonstration.  Patient Response to interventions: Comfortable to return in 1 week for PFM strengthening  ASSESSMENT Patient presents to clinic with excellent motivation to participate in therapy. Patient demonstrates deficits in PFM strength, PFM coordination, and IAP management. Patient had significant multijoint compensations with spinal mobility interventions during today's session and responded positively to manual interventions. Patient will benefit from continued skilled therapeutic intervention to address remaining deficits in PFM strength, PFM coordination, and IAP management in order to increase function and improve overall QOL.     PT Long Term Goals - 04/24/21 1840      PT LONG TERM GOAL #1   Title Patient will demonstrate independence with HEP in order to maximize therapeutic gains and improve carryover from physical therapy sessions to ADLs in the home and community.    Baseline IE: not initiated    Time 12    Period Weeks    Status New    Target Date 07/17/21      PT LONG TERM GOAL #2   Title Patient will report BMs classified as Type 3-Type 4 on the [facility] Stool Chart greater than 90% of the time to demonstrate improved motility and stool  bulking in order to decrease fecal distress and improve overall QOL.    Baseline IE: Type 3-4: 75%    Time 12    Period Weeks    Status New    Target Date 07/17/21      PT LONG TERM GOAL #3   Title Patient will demonstrate improved function as evidenced by a score of 68 on FOTO measure for full participation in activities at home and  in the community.    Baseline IE: 57    Time 12    Period Weeks    Status New    Target Date 07/17/21      PT LONG TERM GOAL #4   Title Patient will be able to articulate and demonstrate appropriate body mechanics for management of intra-abdominal pressure as well as bone density precautions for improved function/participation at home and in the community.    Baseline IE: not demonstrated    Time 12    Period Weeks    Status New    Target Date 07/17/21      PT LONG TERM GOAL #5   Title Patient will demonstrate circumferential and sequential contraction of >4/5 MMT, > 5 sec hold x5 and 5 consecutive quick flicks with </= 10 min rest between testing bouts, and relaxation of the PFM coordinated with breath for improved management of intra-abdominal pressure and normal bowel and bladder function without the presence of pain nor incontinence in order to improve participation at home and in the community.    Baseline IE: to be assessed at next visit    Time 12    Period Weeks    Status New    Target Date 07/17/21      Additional Long Term Goals   Additional Long Term Goals Yes      PT LONG TERM GOAL #6   Title Patient will demonstrate coordinated lengthening and relaxation of PFM with diaphragmatic inhalation in order to decrease spasm and allow for unrestricted elimination of urine/feces for improved overall QOL.    Baseline IE: to be assessed at next visit    Time 12    Period Weeks    Status New    Target Date 07/17/21                 Plan - 05/28/21 0810    Clinical Impression Statement Patient presents to clinic with excellent motivation to participate in therapy. Patient demonstrates deficits in PFM strength, PFM coordination, and IAP management. Patient had significant multijoint compensations with spinal mobility interventions during today's session and responded positively to manual interventions. Patient will benefit from continued skilled therapeutic intervention to  address remaining deficits in PFM strength, PFM coordination, and IAP management in order to increase function and improve overall QOL.    Personal Factors and Comorbidities Comorbidity 3+;Past/Current Experience;Time since onset of injury/illness/exacerbation;Behavior Pattern;Age    Comorbidities hyperlipidemia, CAD, HTN, atherosclerosis of abdominal aorta    Examination-Activity Limitations Transfers;Continence;Squat;Lift;Bend;Stand    Examination-Participation Restrictions Community Activity;Shop;Cleaning;Laundry;Yard Work;Driving    Stability/Clinical Decision Making Evolving/Moderate complexity    Rehab Potential Fair    PT Frequency 1x / week    PT Duration 12 weeks    PT Treatment/Interventions ADLs/Self Care Home Management;Biofeedback;Cryotherapy;Electrical Stimulation;Moist Heat;Therapeutic exercise;Neuromuscular re-education;Patient/family education;Manual techniques;Scar mobilization;Dry needling;Taping    PT Next Visit Plan PFM strengthening, diaphragmatic release    PT Home Exercise Plan hip flexor stretch, colon massage    Consulted and Agree with Plan of Care Patient  Patient will benefit from skilled therapeutic intervention in order to improve the following deficits and impairments:  Improper body mechanics,Decreased coordination,Decreased activity tolerance,Decreased strength  Visit Diagnosis: Other lack of coordination  Muscle weakness (generalized)     Problem List There are no problems to display for this patient.  Sheria Lang PT, DPT [phone]  05/28/2021, 9:24 AM  [place] [facility] [address] [place], [place], [address] Phone: [phone]   Fax:  [phone]  Name: Tammie Phillips MRN: [id number] Date of Birth: [birth year]

## 2021-05-29 ENCOUNTER — Encounter: Payer: Medicare HMO | Admitting: Physical Therapy

## 2021-05-30 ENCOUNTER — Encounter: Payer: Medicare HMO | Admitting: Physical Therapy

## 2021-06-04 ENCOUNTER — Other Ambulatory Visit: Payer: Self-pay

## 2021-06-04 ENCOUNTER — Encounter: Payer: Self-pay | Admitting: Physical Therapy

## 2021-06-04 ENCOUNTER — Ambulatory Visit: Payer: Medicare HMO | Admitting: Physical Therapy

## 2021-06-04 DIAGNOSIS — M6281 Muscle weakness (generalized): Secondary | ICD-10-CM

## 2021-06-04 DIAGNOSIS — R278 Other lack of coordination: Secondary | ICD-10-CM | POA: Diagnosis not present

## 2021-06-04 NOTE — Therapy (Signed)
[facility] [address] [place], [place], [address] Phone: [phone]   Fax:  [phone]  Physical Therapy Treatment  Patient Details  Name: Tammie Phillips MRN: [id number] Date of Birth: December 17, 1946 Referring Provider (PT): Leim Fabry   Encounter Date: 06/04/2021   PT End of Session - 06/04/21 0801     Visit Number 6    Number of Visits 12    Date for PT Re-Evaluation 07/17/21    Authorization Type IE 04/24/2021    PT Start Time 0800    PT Stop Time 0840    PT Time Calculation (min) 40 min    Activity Tolerance Patient tolerated treatment well    Behavior During Therapy [facility] for tasks assessed/performed             History reviewed. No pertinent past medical history.  Past Surgical History:  Procedure Laterality Date   ABDOMINAL HYSTERECTOMY     partial   BREAST BIOPSY Right 11/25/2019   calcs, x marker,neg   BREAST BIOPSY Right 11/25/2019   calcs, ribbon marker, neg   LAPAROSCOPIC APPENDECTOMY      There were no vitals filed for this visit.   Subjective Assessment - 06/04/21 0802     Subjective Patient notes she has been doing pretty well this week. She had an upset stomach but remained in good control.    Currently in Pain? No/denies            TREATMENT Manual Therapy: Diaphragmatic MFR for improved rib cage mobility and bowel function  Neuromuscular Re-education: Supine hooklying diaphragmatic breathing with VCs and TCs for downregulation of the nervous system and improved management of IAP Supine hooklying, PFM lengthening with inhalation. VCs and TCs to decrease compensatory patterns and encourage optimal relaxation of the PFM. Supine hooklying, PFM contractions with exhalation. VCs and TCs to decrease compensatory patterns and encourage activation of the PFM.    Patient educated throughout session on appropriate technique and form using multi-modal cueing, HEP, and activity  modification. Patient articulated understanding and returned demonstration.  Patient Response to interventions: Comfortable to return in 1 week.  ASSESSMENT Patient presents to clinic with excellent motivation to participate in therapy. Patient demonstrates deficits in PFM strength, PFM coordination, and IAP management. Patient able to coordinate isolated PFM contraction with mod/max VCs during today's session and responded positively to manual interventions. Patient will benefit from continued skilled therapeutic intervention to address remaining deficits in PFM strength, PFM coordination, and IAP management in order to increase function and improve overall QOL.   PT Long Term Goals - 04/24/21 1840       PT LONG TERM GOAL #1   Title Patient will demonstrate independence with HEP in order to maximize therapeutic gains and improve carryover from physical therapy sessions to ADLs in the home and community.    Baseline IE: not initiated    Time 12    Period Weeks    Status New    Target Date 07/17/21      PT LONG TERM GOAL #2   Title Patient will report BMs classified as Type 3-Type 4 on the [facility] Stool Chart greater than 90% of the time to demonstrate improved motility and stool bulking in order to decrease fecal distress and improve overall QOL.    Baseline IE: Type 3-4: 75%    Time 12    Period Weeks    Status New    Target Date 07/17/21  PT LONG TERM GOAL #3   Title Patient will demonstrate improved function as evidenced by a score of 68 on FOTO measure for full participation in activities at home and in the community.    Baseline IE: 57    Time 12    Period Weeks    Status New    Target Date 07/17/21      PT LONG TERM GOAL #4   Title Patient will be able to articulate and demonstrate appropriate body mechanics for management of intra-abdominal pressure as well as bone density precautions for improved function/participation at home and in the community.    Baseline IE: not  demonstrated    Time 12    Period Weeks    Status New    Target Date 07/17/21      PT LONG TERM GOAL #5   Title Patient will demonstrate circumferential and sequential contraction of >4/5 MMT, > 5 sec hold x5 and 5 consecutive quick flicks with </= 10 min rest between testing bouts, and relaxation of the PFM coordinated with breath for improved management of intra-abdominal pressure and normal bowel and bladder function without the presence of pain nor incontinence in order to improve participation at home and in the community.    Baseline IE: to be assessed at next visit    Time 12    Period Weeks    Status New    Target Date 07/17/21      Additional Long Term Goals   Additional Long Term Goals Yes      PT LONG TERM GOAL #6   Title Patient will demonstrate coordinated lengthening and relaxation of PFM with diaphragmatic inhalation in order to decrease spasm and allow for unrestricted elimination of urine/feces for improved overall QOL.    Baseline IE: to be assessed at next visit    Time 12    Period Weeks    Status New    Target Date 07/17/21                   Plan - 06/04/21 0802     Clinical Impression Statement Patient presents to clinic with excellent motivation to participate in therapy. Patient demonstrates deficits in PFM strength, PFM coordination, and IAP management. Patient able to coordinate isolated PFM contraction with mod/max VCs during today's session and responded positively to manual interventions. Patient will benefit from continued skilled therapeutic intervention to address remaining deficits in PFM strength, PFM coordination, and IAP management in order to increase function and improve overall QOL.    Personal Factors and Comorbidities Comorbidity 3+;Past/Current Experience;Time since onset of injury/illness/exacerbation;Behavior Pattern;Age    Comorbidities hyperlipidemia, CAD, HTN, atherosclerosis of abdominal aorta    Examination-Activity Limitations  Transfers;Continence;Squat;Lift;Bend;Stand    Examination-Participation Restrictions Community Activity;Shop;Cleaning;Laundry;Yard Work;Driving    Stability/Clinical Decision Making Evolving/Moderate complexity    Rehab Potential Fair    PT Frequency 1x / week    PT Duration 12 weeks    PT Treatment/Interventions ADLs/Self Care Home Management;Biofeedback;Cryotherapy;Electrical Stimulation;Moist Heat;Therapeutic exercise;Neuromuscular re-education;Patient/family education;Manual techniques;Scar mobilization;Dry needling;Taping    PT Next Visit Plan PFM strengthening, diaphragmatic release    PT Home Exercise Plan hip flexor stretch, colon massage    Consulted and Agree with Plan of Care Patient             Patient will benefit from skilled therapeutic intervention in order to improve the following deficits and impairments:  Improper body mechanics, Decreased coordination, Decreased activity tolerance, Decreased strength  Visit Diagnosis: Other lack of coordination  Muscle weakness (generalized)     Problem List There are no problems to display for this patient.   Sheria Lang PT, DPT [phone]  06/04/2021, 9:11 AM  [facility] [address] [place], [place], [address] Phone: [phone]   Fax:  [phone]  Name: Tammie Phillips MRN: [id number] Date of Birth: Dec 24, 1945

## 2021-06-05 ENCOUNTER — Encounter: Payer: Medicare HMO | Admitting: Physical Therapy

## 2021-06-06 ENCOUNTER — Encounter: Payer: Medicare HMO | Admitting: Physical Therapy

## 2021-06-12 ENCOUNTER — Encounter: Payer: Medicare HMO | Admitting: Physical Therapy

## 2021-06-12 ENCOUNTER — Other Ambulatory Visit: Payer: Self-pay

## 2021-06-12 ENCOUNTER — Ambulatory Visit: Payer: Medicare HMO | Admitting: Physical Therapy

## 2021-06-12 ENCOUNTER — Encounter: Payer: Self-pay | Admitting: Physical Therapy

## 2021-06-12 DIAGNOSIS — M6281 Muscle weakness (generalized): Secondary | ICD-10-CM

## 2021-06-12 DIAGNOSIS — R278 Other lack of coordination: Secondary | ICD-10-CM

## 2021-06-12 NOTE — Therapy (Addendum)
[facility] [address] [place], [place], [address] Phone: [phone]   Fax:  [phone]  Physical Therapy Treatment  Patient Details  Name: Tammie Phillips MRN: [id number] Date of Birth: [birth year] Referring Provider (PT): Leim Fabry   Encounter Date: 06/12/2021   PT End of Session - 06/12/21 0903     Visit Number 7    Number of Visits 12    Date for PT Re-Evaluation 07/17/21    Authorization Type IE 04/24/2021    PT Start Time 0845    PT Stop Time 0940    PT Time Calculation (min) 55 min    Activity Tolerance Patient tolerated treatment well    Behavior During Therapy [facility] for tasks assessed/performed             History reviewed. No pertinent past medical history.  Past Surgical History:  Procedure Laterality Date   ABDOMINAL HYSTERECTOMY     partial   BREAST BIOPSY Right 11/25/2019   calcs, x marker,neg   BREAST BIOPSY Right 11/25/2019   calcs, ribbon marker, neg   LAPAROSCOPIC APPENDECTOMY      There were no vitals filed for this visit.   Subjective Assessment - 06/12/21 0852     Subjective Patient reports she is having difficulty fitting in all of her exercises. Patient also noting some mild concern over 4-5 lb unintentional weight loss in the past year.    Currently in Pain? No/denies            TREATMENT Manual Therapy: Diaphragmatic MFR for improved rib cage mobility and bowel function Intercostal mm release on L ribs 7-12 for improved diaphragmatic mobility  Neuromuscular Re-education: Supine hooklying diaphragmatic breathing with VCs and TCs for downregulation of the nervous system and improved management of IAP Sidelying diaphragmatic breathing with VCs and TCs for downregulation of the nervous system and improved management of IAP Patient educated on intra-abdominal pressure system and impacts on pressure from, breath patterns, PFM, and core musculature. Patient educated on common  times when breath holding occurs: high effort, high thought, high stress, high pain activities. Patient educated on role of calcium, vitamin D, and protein consumption for maintaining muscle mass and bone density.    Patient educated throughout session on appropriate technique and form using multi-modal cueing, HEP, and activity modification. Patient articulated understanding and returned demonstration.  Patient Response to interventions: Comfortable to work independently for 3 weeks.  ASSESSMENT Patient presents to clinic with excellent motivation to participate in therapy. Patient demonstrates deficits in PFM strength, PFM coordination, and IAP management. Patient able to coordinate diaphragmatic breathing after IAP education and a variety of verbal cues during today's session and responded positively to manual interventions. Patient will benefit from continued skilled therapeutic intervention to address remaining deficits in PFM strength, PFM coordination, and IAP management in order to increase function and improve overall QOL.    PT Long Term Goals - 04/24/21 1840       PT LONG TERM GOAL #1   Title Patient will demonstrate independence with HEP in order to maximize therapeutic gains and improve carryover from physical therapy sessions to ADLs in the home and community.    Baseline IE: not initiated    Time 12    Period Weeks    Status New    Target Date 07/17/21      PT LONG TERM GOAL #2   Title Patient will report BMs classified as Type 3-Type 4 on the [facility] Stool  Chart greater than 90% of the time to demonstrate improved motility and stool bulking in order to decrease fecal distress and improve overall QOL.    Baseline IE: Type 3-4: 75%    Time 12    Period Weeks    Status New    Target Date 07/17/21      PT LONG TERM GOAL #3   Title Patient will demonstrate improved function as evidenced by a score of 68 on FOTO measure for full participation in activities at home and in  the community.    Baseline IE: 57    Time 12    Period Weeks    Status New    Target Date 07/17/21      PT LONG TERM GOAL #4   Title Patient will be able to articulate and demonstrate appropriate body mechanics for management of intra-abdominal pressure as well as bone density precautions for improved function/participation at home and in the community.    Baseline IE: not demonstrated    Time 12    Period Weeks    Status New    Target Date 07/17/21      PT LONG TERM GOAL #5   Title Patient will demonstrate circumferential and sequential contraction of >4/5 MMT, > 5 sec hold x5 and 5 consecutive quick flicks with </= 10 min rest between testing bouts, and relaxation of the PFM coordinated with breath for improved management of intra-abdominal pressure and normal bowel and bladder function without the presence of pain nor incontinence in order to improve participation at home and in the community.    Baseline IE: to be assessed at next visit    Time 12    Period Weeks    Status New    Target Date 07/17/21      Additional Long Term Goals   Additional Long Term Goals Yes      PT LONG TERM GOAL #6   Title Patient will demonstrate coordinated lengthening and relaxation of PFM with diaphragmatic inhalation in order to decrease spasm and allow for unrestricted elimination of urine/feces for improved overall QOL.    Baseline IE: to be assessed at next visit    Time 12    Period Weeks    Status New    Target Date 07/17/21                   Plan - 06/12/21 5102     Clinical Impression Statement Patient presents to clinic with excellent motivation to participate in therapy. Patient demonstrates deficits in PFM strength, PFM coordination, and IAP management. Patient able to coordinate diaphragmatic breathing after IAP education and a variety of verbal cues during today's session and responded positively to manual interventions. Patient will benefit from continued skilled  therapeutic intervention to address remaining deficits in PFM strength, PFM coordination, and IAP management in order to increase function and improve overall QOL.    Personal Factors and Comorbidities Comorbidity 3+;Past/Current Experience;Time since onset of injury/illness/exacerbation;Behavior Pattern;Age    Comorbidities hyperlipidemia, CAD, HTN, atherosclerosis of abdominal aorta    Examination-Activity Limitations Transfers;Continence;Squat;Lift;Bend;Stand    Examination-Participation Restrictions Community Activity;Shop;Cleaning;Laundry;Yard Work;Driving    Stability/Clinical Decision Making Evolving/Moderate complexity    Rehab Potential Fair    PT Frequency 1x / week    PT Duration 12 weeks    PT Treatment/Interventions ADLs/Self Care Home Management;Biofeedback;Cryotherapy;Electrical Stimulation;Moist Heat;Therapeutic exercise;Neuromuscular re-education;Patient/family education;Manual techniques;Scar mobilization;Dry needling;Taping    PT Next Visit Plan PFM strengthening, diaphragmatic release    PT Home Exercise  Plan hip flexor stretch, colon massage    Consulted and Agree with Plan of Care Patient             Patient will benefit from skilled therapeutic intervention in order to improve the following deficits and impairments:  Improper body mechanics, Decreased coordination, Decreased activity tolerance, Decreased strength  Visit Diagnosis: Other lack of coordination  Muscle weakness (generalized)     Problem List There are no problems to display for this patient.   Sheria Lang PT, DPT [phone]  06/12/2021, 10:36 AM  [facility] [address] [place], [place], [address] Phone: [phone]   Fax:  [phone]  Name: Tammie Phillips MRN: [id number] Date of Birth: Aug 01, 1946

## 2021-06-13 ENCOUNTER — Encounter: Payer: Medicare HMO | Admitting: Physical Therapy

## 2021-06-19 ENCOUNTER — Ambulatory Visit: Payer: Medicare HMO | Admitting: Physical Therapy

## 2021-06-20 ENCOUNTER — Encounter: Payer: Medicare HMO | Admitting: Physical Therapy

## 2021-07-03 ENCOUNTER — Other Ambulatory Visit: Payer: Self-pay

## 2021-07-03 ENCOUNTER — Encounter: Payer: Self-pay | Admitting: Physical Therapy

## 2021-07-03 ENCOUNTER — Ambulatory Visit: Payer: Medicare HMO | Attending: Family Medicine | Admitting: Physical Therapy

## 2021-07-03 DIAGNOSIS — R278 Other lack of coordination: Secondary | ICD-10-CM | POA: Diagnosis not present

## 2021-07-03 DIAGNOSIS — M6281 Muscle weakness (generalized): Secondary | ICD-10-CM | POA: Diagnosis present

## 2021-07-03 NOTE — Therapy (Signed)
[facility] [address] [place], [place], [address] Phone: [phone]   Fax:  [phone]  Physical Therapy Treatment  Patient Details  Name: Tammie Phillips MRN: [id number] Date of Birth: 1946-07-18 Referring Provider (PT): Leim Fabry   Encounter Date: 07/03/2021   PT End of Session - 07/03/21 0954     Visit Number 8    Number of Visits 12    Date for PT Re-Evaluation 07/17/21    Authorization Type IE 04/24/2021    PT Start Time 0945    PT Stop Time 1025    PT Time Calculation (min) 40 min    Activity Tolerance Patient tolerated treatment well    Behavior During Therapy [facility] for tasks assessed/performed             History reviewed. No pertinent past medical history.  Past Surgical History:  Procedure Laterality Date   ABDOMINAL HYSTERECTOMY     partial   BREAST BIOPSY Right 11/25/2019   calcs, x marker,neg   BREAST BIOPSY Right 11/25/2019   calcs, ribbon marker, neg   LAPAROSCOPIC APPENDECTOMY      There were no vitals filed for this visit.   Subjective Assessment - 07/03/21 0947     Subjective Patient notes that she has had a good few weeks, but she notes that if her stomach gets upset she notices more difficulty with control. Patient notes that she even went a week without any smearing. Patient also only had one day with LLQ pain.    Currently in Pain? No/denies             TREATMENT Neuromuscular Re-education: Supine hooklying diaphragmatic breathing with VCs and TCs for downregulation of the nervous system and improved management of IAP Supine hooklying, PFM lengthening with inhalation. VCs and TCs to decrease compensatory patterns and encourage optimal relaxation of the PFM. Supine hooklying, PFM contractions with exhalation. VCs and TCs to decrease compensatory patterns and encourage activation of the PFM. Reassessed goals; see below.     Patient educated throughout session on  appropriate technique and form using multi-modal cueing, HEP, and activity modification. Patient articulated understanding and returned demonstration.  Patient Response to interventions: Comfortable to work independently for 2 weeks.  ASSESSMENT Patient presents to clinic with excellent motivation to participate in therapy. Patient demonstrates deficits in PFM strength, PFM coordination, and IAP management. Patient continues to demonstrate improving PFM coordination and strength during today's session and responded positively to PFM interventions. Patient will benefit from continued skilled therapeutic intervention to address remaining deficits in PFM strength, PFM coordination, and IAP management in order to increase function and improve overall QOL.    PT Long Term Goals - 07/03/21 0956       PT LONG TERM GOAL #1   Title Patient will demonstrate independence with HEP in order to maximize therapeutic gains and improve carryover from physical therapy sessions to ADLs in the home and community.    Baseline IE: not initiated; 7/12: IND    Time 12    Period Weeks    Status Achieved    Target Date 07/17/21      PT LONG TERM GOAL #2   Title Patient will report BMs classified as Type 3-Type 4 on the [facility] Stool Chart greater than 90% of the time to demonstrate improved motility and stool bulking in order to decrease fecal distress and improve overall QOL.    Baseline IE: Type 3-4: 75%; 7/12: Type 4-5, 90%  Time 12    Period Weeks    Status On-going    Target Date 07/17/21      PT LONG TERM GOAL #3   Title Patient will demonstrate improved function as evidenced by a score of 68 on FOTO measure for full participation in activities at home and in the community.    Baseline IE: 57; 7/12: 59    Time 12    Period Weeks    Status On-going    Target Date 07/17/21      PT LONG TERM GOAL #4   Title Patient will be able to articulate and demonstrate appropriate body mechanics for management  of intra-abdominal pressure as well as bone density precautions for improved function/participation at home and in the community.    Baseline IE: not demonstrated; 7/12: able to demonstrate    Time 12    Period Weeks    Status On-going    Target Date 07/17/21      PT LONG TERM GOAL #5   Title Patient will demonstrate circumferential and sequential contraction of >4/5 MMT, > 5 sec hold x5 and 5 consecutive quick flicks with </= 10 min rest between testing bouts, and relaxation of the PFM coordinated with breath for improved management of intra-abdominal pressure and normal bowel and bladder function without the presence of pain nor incontinence in order to improve participation at home and in the community.    Baseline IE: to be assessed at next visit; 7/12: 3/5 MMT, x4 reps, 2 sec x2    Time 12    Period Weeks    Status On-going    Target Date 07/17/21      PT LONG TERM GOAL #6   Title Patient will demonstrate coordinated lengthening and relaxation of PFM with diaphragmatic inhalation in order to decrease spasm and allow for unrestricted elimination of urine/feces for improved overall QOL.    Baseline IE: to be assessed at next visit; 7/12: mod cueing    Time 12    Period Weeks    Status On-going    Target Date 07/17/21                   Plan - 07/03/21 0954     Clinical Impression Statement Patient presents to clinic with excellent motivation to participate in therapy. Patient demonstrates deficits in PFM strength, PFM coordination, and IAP management. Patient continues to demonstrate improving PFM coordination and strength during today's session and responded positively to PFM interventions. Patient will benefit from continued skilled therapeutic intervention to address remaining deficits in PFM strength, PFM coordination, and IAP management in order to increase function and improve overall QOL.    Personal Factors and Comorbidities Comorbidity 3+;Past/Current Experience;Time  since onset of injury/illness/exacerbation;Behavior Pattern;Age    Comorbidities hyperlipidemia, CAD, HTN, atherosclerosis of abdominal aorta    Examination-Activity Limitations Transfers;Continence;Squat;Lift;Bend;Stand    Examination-Participation Restrictions Community Activity;Shop;Cleaning;Laundry;Yard Work;Driving    Stability/Clinical Decision Making Evolving/Moderate complexity    Rehab Potential Fair    PT Frequency 1x / week    PT Duration 12 weeks    PT Treatment/Interventions ADLs/Self Care Home Management;Biofeedback;Cryotherapy;Electrical Stimulation;Moist Heat;Therapeutic exercise;Neuromuscular re-education;Patient/family education;Manual techniques;Scar mobilization;Dry needling;Taping    PT Next Visit Plan PFM strengthening, diaphragmatic release    PT Home Exercise Plan hip flexor stretch, colon massage    Consulted and Agree with Plan of Care Patient             Patient will benefit from skilled therapeutic intervention in order to improve  the following deficits and impairments:  Improper body mechanics, Decreased coordination, Decreased activity tolerance, Decreased strength  Visit Diagnosis: Other lack of coordination  Muscle weakness (generalized)     Problem List There are no problems to display for this patient.   Sheria Lang PT, DPT [phone]  07/03/2021, 3:19 PM  [facility] [address] [place], [place], [address] Phone: [phone]   Fax:  [phone]  Name: Tammie Phillips MRN: [id number] Date of Birth: [birth year]

## 2021-07-17 ENCOUNTER — Encounter: Payer: Self-pay | Admitting: Physical Therapy

## 2021-07-17 ENCOUNTER — Other Ambulatory Visit: Payer: Self-pay

## 2021-07-17 ENCOUNTER — Ambulatory Visit: Payer: Medicare HMO | Admitting: Physical Therapy

## 2021-07-17 DIAGNOSIS — M6281 Muscle weakness (generalized): Secondary | ICD-10-CM

## 2021-07-17 DIAGNOSIS — R278 Other lack of coordination: Secondary | ICD-10-CM

## 2021-07-17 NOTE — Therapy (Signed)
[facility] [address] [place], [place], [address] Phone: [phone]   Fax:  [phone]  Physical Therapy Treatment  Patient Details  Name: Tammie Phillips MRN: [id number] Date of Birth: 09-Sep-1946 Referring Provider (PT): Leim Fabry   Encounter Date: 07/17/2021   PT End of Session - 07/17/21 0936     Visit Number 9    Number of Visits 12    Date for PT Re-Evaluation 07/17/21    Authorization Type IE 04/24/2021    PT Start Time 0940    PT Stop Time 1020    PT Time Calculation (min) 40 min    Activity Tolerance Patient tolerated treatment well    Behavior During Therapy [facility] for tasks assessed/performed             History reviewed. No pertinent past medical history.  Past Surgical History:  Procedure Laterality Date   ABDOMINAL HYSTERECTOMY     partial   BREAST BIOPSY Right 11/25/2019   calcs, x marker,neg   BREAST BIOPSY Right 11/25/2019   calcs, ribbon marker, neg   LAPAROSCOPIC APPENDECTOMY      There were no vitals filed for this visit.   Subjective Assessment - 07/17/21 0942     Subjective Patient continues to have good control and no soiling of the pants since last visit. Patient reports that she does note lack of control when she has an upset stomach/has eaten something that doesn't agree with her.    Currently in Pain? No/denies              TREATMENT Neuromuscular Re-education: Supine hooklying diaphragmatic breathing with VCs and TCs for downregulation of the nervous system and improved management of IAP Supine hooklying, PFM lengthening with inhalation. VCs and TCs to decrease compensatory patterns and encourage optimal relaxation of the PFM. Supine hooklying, PFM contractions with exhalation. VCs and TCs to decrease compensatory patterns and encourage activation of the PFM. Discussion with patient about use of vaginal weights for improved proprioceptive sense to facilitate improved  PFM motor control. Discussion with patient about layering strengthening HEP with daily habits for improved adherence to programming.     Patient educated throughout session on appropriate technique and form using multi-modal cueing, HEP, and activity modification. Patient articulated understanding and returned demonstration.  Patient Response to interventions: Comfortable to work independently for 2 weeks.  ASSESSMENT Patient presents to clinic with excellent motivation to participate in therapy. Patient demonstrates deficits in PFM strength, PFM coordination, and IAP management. Patient continuing to have greatest difficulty with proprioceptive sense at PFM as well as consistency with HEP during today's session but responded positively to educational interventions. Patient will benefit from continued skilled therapeutic intervention to address remaining deficits in PFM strength, PFM coordination, and IAP management in order to increase function and improve overall QOL.    PT Long Term Goals - 07/17/21 0947       PT LONG TERM GOAL #1   Title Patient will demonstrate independence with HEP in order to maximize therapeutic gains and improve carryover from physical therapy sessions to ADLs in the home and community.    Baseline IE: not initiated; 7/12: IND    Time 12    Period Weeks    Status Achieved      PT LONG TERM GOAL #2   Title Patient will report BMs classified as Type 3-Type 4 on the [facility] Stool Chart greater than 90% of the time to demonstrate improved motility and stool  bulking in order to decrease fecal distress and improve overall QOL.    Baseline IE: Type 3-4: 75%; 7/12: Type 4-5, 90%    Time 12    Period Weeks    Status On-going    Target Date 07/17/21      PT LONG TERM GOAL #3   Title Patient will demonstrate improved function as evidenced by a score of 68 on FOTO measure for full participation in activities at home and in the community.    Baseline IE: 57; 7/12: 59     Time 12    Period Weeks    Status On-going    Target Date 07/17/21      PT LONG TERM GOAL #4   Title Patient will be able to articulate and demonstrate appropriate body mechanics for management of intra-abdominal pressure as well as bone density precautions for improved function/participation at home and in the community.    Baseline IE: not demonstrated; 7/12: able to demonstrate    Time 12    Period Weeks    Status On-going    Target Date 07/17/21      PT LONG TERM GOAL #5   Title Patient will demonstrate circumferential and sequential contraction of >4/5 MMT, > 5 sec hold x5 and 5 consecutive quick flicks with </= 10 min rest between testing bouts, and relaxation of the PFM coordinated with breath for improved management of intra-abdominal pressure and normal bowel and bladder function without the presence of pain nor incontinence in order to improve participation at home and in the community.    Baseline IE: to be assessed at next visit; 7/12: 3/5 MMT, x4 reps, 2 sec x2; 7/26:    Time 12    Period Weeks    Status On-going    Target Date 07/17/21      PT LONG TERM GOAL #6   Title Patient will demonstrate coordinated lengthening and relaxation of PFM with diaphragmatic inhalation in order to decrease spasm and allow for unrestricted elimination of urine/feces for improved overall QOL.    Baseline IE: to be assessed at next visit; 7/12: mod cueing    Time 12    Period Weeks    Status On-going    Target Date 07/17/21                   Plan - 07/17/21 0936     Clinical Impression Statement Patient presents to clinic with excellent motivation to participate in therapy. Patient demonstrates deficits in PFM strength, PFM coordination, and IAP management. Patient continuing to have greatest difficulty with proprioceptive sense at PFM as well as consistency with HEP during today's session but responded positively to educational interventions. Patient will benefit from continued  skilled therapeutic intervention to address remaining deficits in PFM strength, PFM coordination, and IAP management in order to increase function and improve overall QOL.    Personal Factors and Comorbidities Comorbidity 3+;Past/Current Experience;Time since onset of injury/illness/exacerbation;Behavior Pattern;Age    Comorbidities hyperlipidemia, CAD, HTN, atherosclerosis of abdominal aorta    Examination-Activity Limitations Transfers;Continence;Squat;Lift;Bend;Stand    Examination-Participation Restrictions Community Activity;Shop;Cleaning;Laundry;Yard Work;Driving    Stability/Clinical Decision Making Evolving/Moderate complexity    Rehab Potential Fair    PT Frequency 1x / week    PT Duration 12 weeks    PT Treatment/Interventions ADLs/Self Care Home Management;Biofeedback;Cryotherapy;Electrical Stimulation;Moist Heat;Therapeutic exercise;Neuromuscular re-education;Patient/family education;Manual techniques;Scar mobilization;Dry needling;Taping    PT Next Visit Plan PFM strengthening, diaphragmatic release    PT Home Exercise Plan hip flexor stretch, colon  massage    Consulted and Agree with Plan of Care Patient             Patient will benefit from skilled therapeutic intervention in order to improve the following deficits and impairments:  Improper body mechanics, Decreased coordination, Decreased activity tolerance, Decreased strength  Visit Diagnosis: Other lack of coordination  Muscle weakness (generalized)     Problem List There are no problems to display for this patient.   Sheria Lang PT, DPT [id number]  07/17/2021, 1:42 PM  [facility] [address] [place], [place], [address] Phone: [phone]   Fax:  [phone]  Name: Lisl Slingerland MRN: [id number] Date of Birth: 1946-01-23

## 2021-07-31 ENCOUNTER — Ambulatory Visit: Payer: Medicare HMO | Admitting: Physical Therapy

## 2021-08-14 ENCOUNTER — Ambulatory Visit: Payer: Medicare HMO | Attending: Family Medicine | Admitting: Physical Therapy

## 2021-08-14 ENCOUNTER — Other Ambulatory Visit: Payer: Self-pay

## 2021-08-14 ENCOUNTER — Encounter: Payer: Self-pay | Admitting: Physical Therapy

## 2021-08-14 DIAGNOSIS — R278 Other lack of coordination: Secondary | ICD-10-CM | POA: Diagnosis present

## 2021-08-14 DIAGNOSIS — M6281 Muscle weakness (generalized): Secondary | ICD-10-CM | POA: Insufficient documentation

## 2021-08-14 NOTE — Therapy (Signed)
[facility] [facility] [place] [address] [place], [place], [address] Phone: [phone]   Fax:  [phone]  Physical Therapy Treatment  Patient Details  Name: Tammie Phillips MRN: [id number] Date of Birth: 1946/10/27 Referring Provider (PT): Leim Fabry   Encounter Date: 08/14/2021   PT End of Session - 08/14/21 1051     Visit Number 10    Number of Visits 24    Date for PT Re-Evaluation 11/06/21    Authorization Type IE 04/24/2021; PN 08/14/2021    PT Start Time 0900    PT Stop Time 0950    PT Time Calculation (min) 50 min    Activity Tolerance Patient tolerated treatment well    Behavior During Therapy [facility] for tasks assessed/performed             History reviewed. No pertinent past medical history.  Past Surgical History:  Procedure Laterality Date   ABDOMINAL HYSTERECTOMY     partial   BREAST BIOPSY Right 11/25/2019   calcs, x marker,neg   BREAST BIOPSY Right 11/25/2019   calcs, ribbon marker, neg   LAPAROSCOPIC APPENDECTOMY      There were no vitals filed for this visit.   Subjective Assessment - 08/14/21 1047     Subjective Patient presents to clinic with feelings of frustration regarding bowel symptoms. Patient notes she has had a rough couple weeks with her stools being less formed, increased irritation and bleeding from hemorrhoids, and general GI discomfort. Patient notes seepage of mucous, liquid, and smearing of stool with sensations of incomplete emptying. Cannot pinpoint any specific changes to diet or activity that would have prompted increased symptoms. Notes that she has stopped taking Fibercon as it was causing her symptoms to worsen; however, she did find an OTC supplement that has offered her significant hemorrhoid relief. Patient admits that she has been less consistent with PFM exercises, but she has continued to work on them with limited benefit. Patient will be traveling out of the country for the month of  September but would like to return to PFPT upon her return.    Currently in Pain? No/denies              TREATMENT Neuromuscular Re-education: Supine hooklying diaphragmatic breathing with VCs and TCs for downregulation of the nervous system and improved management of IAP Supine hooklying, PFM lengthening with inhalation. VCs and TCs to decrease compensatory patterns and encourage optimal relaxation of the PFM. Supine hooklying, PFM contractions with exhalation. VCs and TCs to decrease compensatory patterns and encourage activation of the PFM. Patient education on further treatment options (use of biofeedback, e-stim, and rectal balloon training) and benefits of specialist referral including: GI, integrative medicine, and dietitian.     Patient educated throughout session on appropriate technique and form using multi-modal cueing, HEP, and activity modification. Patient articulated understanding and returned demonstration.  Patient Response to interventions: Appreciative of information.  ASSESSMENT Patient presents to clinic with excellent motivation to participate in therapy. Patient demonstrates deficits in PFM strength, PFM coordination, and IAP management. Patient indicates minimal improvement with PFPT during today's session despite application of behavioral changes and prescribed exercises. While physical therapy has not demonstrated significant change to date, all approaches have not been exhausted and patient would like to continue to follow with PT and trial other interventional approaches. It would likely be beneficial for patient to participate in further testing and referral to a GI provider with extensive experience treating fecal incontinence. Patient's condition has  the potential to improve in response to therapy. Maximum improvement is yet to be obtained. The anticipated improvement is attainable and reasonable in a generally predictable time. Patient will benefit from  continued skilled therapeutic intervention to address remaining deficits in PFM strength, PFM coordination, and IAP management in order to increase function and improve overall QOL.     PT Long Term Goals - 08/14/21 3016       PT LONG TERM GOAL #1   Title Patient will demonstrate independence with HEP in order to maximize therapeutic gains and improve carryover from physical therapy sessions to ADLs in the home and community.    Baseline IE: not initiated; 7/12: IND    Time 12    Period Weeks    Status Achieved      PT LONG TERM GOAL #2   Title Patient will report BMs classified as Type 3-Type 4 on the [facility] Stool Chart greater than 90% of the time to demonstrate improved motility and stool bulking in order to decrease fecal distress and improve overall QOL.    Baseline IE: Type 3-4: 75%; 7/12: Type 4-5, 90%; 8/23: Type 3-4, 50%    Time 12    Period Weeks    Status On-going    Target Date 11/06/21      PT LONG TERM GOAL #3   Title Patient will demonstrate improved function as evidenced by a score of 68 on FOTO measure for full participation in activities at home and in the community.    Baseline IE: 57; 7/12: 59; 8/23: 55    Time 12    Period Weeks    Status On-going    Target Date 11/06/21      PT LONG TERM GOAL #4   Title Patient will be able to articulate and demonstrate appropriate body mechanics for management of intra-abdominal pressure as well as bone density precautions for improved function/participation at home and in the community.    Baseline IE: not demonstrated; 7/12: able to demonstrate    Time 12    Period Weeks    Status On-going    Target Date 11/06/21      PT LONG TERM GOAL #5   Title Patient will demonstrate circumferential and sequential contraction of >4/5 MMT, > 5 sec hold x5 and 5 consecutive quick flicks with </= 10 min rest between testing bouts, and relaxation of the PFM coordinated with breath for improved management of intra-abdominal pressure and  normal bowel and bladder function without the presence of pain nor incontinence in order to improve participation at home and in the community.    Baseline IE: to be assessed at next visit; 7/12: 3/5 MMT, x4 reps, 2 sec x2; 8/23: 2/5 MMT, x2 reps    Time 12    Period Weeks    Status On-going    Target Date 11/06/21      PT LONG TERM GOAL #6   Title Patient will demonstrate coordinated lengthening and relaxation of PFM with diaphragmatic inhalation in order to decrease spasm and allow for unrestricted elimination of urine/feces for improved overall QOL.    Baseline IE: to be assessed at next visit; 7/12: mod cueing; 8/23: not able ot coordinate    Time 12    Period Weeks    Status On-going    Target Date 11/06/21                   Plan - 08/14/21 1052     Clinical Impression Statement  Patient presents to clinic with excellent motivation to participate in therapy. Patient demonstrates deficits in PFM strength, PFM coordination, and IAP management. Patient indicates minimal improvement with PFPT during today's session despite application of behavioral changes and prescribed exercises. While physical therapy has not demonstrated significant change to date, all approaches have not been exhausted and patient would like to continue to follow with PT and trial other interventional approaches. It would likely be beneficial for patient to participate in further testing and referral to a GI provider with extensive experience treating fecal incontinence. Patient's condition has the potential to improve in response to therapy. Maximum improvement is yet to be obtained. The anticipated improvement is attainable and reasonable in a generally predictable time. Patient will benefit from continued skilled therapeutic intervention to address remaining deficits in PFM strength, PFM coordination, and IAP management in order to increase function and improve overall QOL.    Personal Factors and Comorbidities  Comorbidity 3+;Past/Current Experience;Time since onset of injury/illness/exacerbation;Behavior Pattern;Age    Comorbidities hyperlipidemia, CAD, HTN, atherosclerosis of abdominal aorta    Examination-Activity Limitations Transfers;Continence;Squat;Lift;Bend;Stand    Examination-Participation Restrictions Community Activity;Shop;Cleaning;Laundry;Yard Work;Driving    Stability/Clinical Decision Making Evolving/Moderate complexity    Rehab Potential Fair    PT Frequency 1x / week    PT Duration 12 weeks    PT Treatment/Interventions ADLs/Self Care Home Management;Biofeedback;Cryotherapy;Electrical Stimulation;Moist Heat;Therapeutic exercise;Neuromuscular re-education;Patient/family education;Manual techniques;Scar mobilization;Dry needling;Taping    PT Next Visit Plan TENS, reassess    PT Home Exercise Plan hip flexor stretch, colon massage, PFM strengthening    Consulted and Agree with Plan of Care Patient             Patient will benefit from skilled therapeutic intervention in order to improve the following deficits and impairments:  Improper body mechanics, Decreased coordination, Decreased activity tolerance, Decreased strength  Visit Diagnosis: Other lack of coordination  Muscle weakness (generalized)     Problem List There are no problems to display for this patient.  Sheria Lang PT, DPT [phone]  08/14/2021, 12:35 PM  [facility] [address] [place], [place], [address] Phone: [phone]   Fax:  [phone]  Name: Tammie Phillips MRN: [id number] Date of Birth: 1946-04-20

## 2021-10-25 ENCOUNTER — Ambulatory Visit: Payer: Medicare HMO | Admitting: Physical Therapy

## 2021-10-25 ENCOUNTER — Other Ambulatory Visit: Payer: Self-pay

## 2022-03-05 ENCOUNTER — Ambulatory Visit: Payer: Medicare HMO | Admitting: Physical Therapy

## 2022-04-01 ENCOUNTER — Other Ambulatory Visit: Payer: Self-pay | Admitting: Family Medicine

## 2022-04-01 DIAGNOSIS — Z1231 Encounter for screening mammogram for malignant neoplasm of breast: Secondary | ICD-10-CM

## 2022-04-29 ENCOUNTER — Other Ambulatory Visit: Payer: Self-pay | Admitting: Gastroenterology

## 2022-04-29 DIAGNOSIS — R1032 Left lower quadrant pain: Secondary | ICD-10-CM

## 2022-04-29 DIAGNOSIS — R197 Diarrhea, unspecified: Secondary | ICD-10-CM

## 2022-05-08 ENCOUNTER — Ambulatory Visit
Admission: RE | Admit: 2022-05-08 | Discharge: 2022-05-08 | Disposition: A | Discharge status: Home / Self Care | Payer: Medicare HMO | Source: Ambulatory Visit | Attending: Family Medicine | Admitting: Family Medicine

## 2022-05-08 DIAGNOSIS — Z1231 Encounter for screening mammogram for malignant neoplasm of breast: Secondary | ICD-10-CM | POA: Insufficient documentation

## 2022-05-09 ENCOUNTER — Ambulatory Visit
Admission: RE | Admit: 2022-05-09 | Discharge: 2022-05-09 | Disposition: A | Discharge status: Home / Self Care | Payer: Medicare HMO | Source: Ambulatory Visit | Attending: Gastroenterology | Admitting: Gastroenterology

## 2022-05-09 DIAGNOSIS — R1032 Left lower quadrant pain: Secondary | ICD-10-CM | POA: Insufficient documentation

## 2022-05-09 DIAGNOSIS — R197 Diarrhea, unspecified: Secondary | ICD-10-CM | POA: Insufficient documentation

## 2022-05-09 LAB — POCT I-STAT CREATININE: Creatinine, Ser: 0.7 mg/dL (ref 0.44–1.00)

## 2022-05-09 MED ORDER — IOHEXOL 300 MG/ML  SOLN
100.0000 mL | Freq: Once | INTRAMUSCULAR | Status: AC | PRN
  Administered 2022-05-09: 100 mL via INTRAVENOUS

## 2022-05-13 ENCOUNTER — Other Ambulatory Visit: Payer: Self-pay | Admitting: Physician Assistant

## 2022-05-13 DIAGNOSIS — R928 Other abnormal and inconclusive findings on diagnostic imaging of breast: Secondary | ICD-10-CM

## 2022-05-13 DIAGNOSIS — R921 Mammographic calcification found on diagnostic imaging of breast: Secondary | ICD-10-CM

## 2022-05-15 ENCOUNTER — Ambulatory Visit
Admission: RE | Admit: 2022-05-15 | Discharge: 2022-05-15 | Disposition: A | Discharge status: Home / Self Care | Payer: Medicare HMO | Source: Ambulatory Visit | Attending: Physician Assistant | Admitting: Physician Assistant

## 2022-05-15 DIAGNOSIS — R921 Mammographic calcification found on diagnostic imaging of breast: Secondary | ICD-10-CM | POA: Insufficient documentation

## 2022-05-15 DIAGNOSIS — R928 Other abnormal and inconclusive findings on diagnostic imaging of breast: Secondary | ICD-10-CM | POA: Diagnosis present

## 2022-06-20 ENCOUNTER — Ambulatory Visit: Payer: Medicare HMO | Attending: Gastroenterology | Admitting: Physical Therapy

## 2022-06-20 ENCOUNTER — Encounter: Payer: Self-pay | Admitting: Physical Therapy

## 2022-06-20 DIAGNOSIS — M6281 Muscle weakness (generalized): Secondary | ICD-10-CM | POA: Diagnosis present

## 2022-06-20 DIAGNOSIS — R278 Other lack of coordination: Secondary | ICD-10-CM | POA: Insufficient documentation

## 2022-06-20 NOTE — Therapy (Signed)
OUTPATIENT PHYSICAL THERAPY FEMALE PELVIC EVALUATION   Patient Name: Tammie Phillips MRN: [id number] DOB:1946/10/02, 76 y.o., female Today's Date: 06/20/2022   PT End of Session - 06/20/22 0908     Visit Number 1    Number of Visits 12    Date for PT Re-Evaluation 09/12/22    Authorization Type IE 06/20/2022    PT Start Time 0900    PT Stop Time 0940    PT Time Calculation (min) 40 min    Activity Tolerance Patient tolerated treatment well    Behavior During Therapy [facility] for tasks assessed/performed             History reviewed. No pertinent past medical history. Past Surgical History:  Procedure Laterality Date   ABDOMINAL HYSTERECTOMY     partial   BREAST BIOPSY Right 11/25/2019   calcs, x marker,neg   BREAST BIOPSY Right 11/25/2019   calcs, ribbon marker, neg   LAPAROSCOPIC APPENDECTOMY     There are no problems to display for this patient.   PCP: Gayland Curry, MD  REFERRING PROVIDER: Ok Edwards, NP   REFERRING DIAG: [phone] (ICD-10-CM) - Irritable bowel syndrome with diarrhea M62.89 (ICD-10-CM) - Other specified disorders of muscle   THERAPY DIAG:  Other lack of coordination - Plan: PT plan of care cert/re-cert  Muscle weakness (generalized) - Plan: PT plan of care cert/re-cert  Rationale for Evaluation and Treatment Rehabilitation  PRECAUTIONS: None  WEIGHT BEARING RESTRICTIONS No  FALLS:  Has patient fallen in last 6 months? No  ONSET DATE: 2020-2021  SUBJECTIVE:                                                                                                                                                                                           CHIEF COMPLAINT: Patient returns to PFPT after having work-up for GI symptoms. Patient is in the midst of testing for SIBO but thus far all other testing has been negative. Patient continues to have variable success with management of stool consistency and as a result FI. Patient has  continued to try to perform PFPT exercises from last episode of care, but continues to find it difficult to create a volitional contraction PFM; patient does note awareness of PFM position with diaphragmatic breathing. Patient had some recent success with psyllium tablets, but now notes that she is back to having increased morning BMs which are progressively looser in consistency. Patient has had 5 BMs prior to arrival in clinic this morning at [place].    PERTINENT HISTORY/CHART REVIEW:  Red flags (bowel/bladder changes, saddle paresthesia, personal history of cancer, h/o spinal tumors, h/o compression  fx, h/o abdominal aneurysm, abdominal pain, chills/fever, night sweats, nausea, vomiting, unrelenting pain, first onset of insidious LBP <20 y/o): Negative  Last GI note: "[name] was last seen on 04/29/22 for "diarrhea/llq pain; this is ongoing. Has had negative colon biopsy in past. 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 CT A/P. Daily psyllium for now, could consider some loperamide 69m qd -bid prn. Try the exercises, considering sending back to pfpt. Follow up 6w sooner if problems." CT done as above. Fecal calprotectin and celiac unremarkable Since the last visit, was seen by gyn and had negative pap smear. Did note some atrophic vaginitis. States she is better than at last visit- states she is having less days with loose stools. Feels the psyllium has improved her stool consistency and frequency. Does still have some loose stools, ranges 1-4/d- the more she has the looser they get. There is sometimes still some urgency and fecal leakage but less than before. Denies any problems with abdominal pain/rectal bleeding, further gi concerns. Weight up a pound since last visit. She is not anemic. Colon cancer screening last 10/20 and had negative colon biopsies at that time. Has been treated for pelvic floor dysfunction with PT is past for same concerns, states is helped. Thinks she would like to do this  again. We discussed her symptoms, lab/ct results, and their chronicity and the likelihood that she has IBSD and its management. Note she has had a few abdominal surgeries in past."    PAIN:  Are you having pain? No NPRS scale:  0/10 (current) Will have some LLQ pain when in the middle of a bowel flare up; defines this as soreness/fatigue.   OCCUPATION/LEISURE ACTIVITIES:  Golfing, hiking, tai chi   PLOF:  Independent  PATIENT GOALS: See if PFPT can offer any improvement  OBSTETRICAL HISTORY: G3P1 Deliveries: vaginal  GYNECOLOGICAL HISTORY: Hysterectomy: Yes Abdominal Endometriosis: Negative Pain with exam: No  Prolapse: Cystocele   Heaviness/pressure: No   UROLOGICAL HISTORY: Incontinence: Positive for history. Onset: during working years. Triggers: jumping. Amount: Min/Mod.  Protective undergarments: No  Fluid Intake: 60 oz H20, 2 cups coffee; 1 cup tea caffeinated Nocturia: 0x/night Frequency of urination: every 2-4 hours Pain with urination: Negative Difficulty initiating urination: Negative Intermittent stream: Positive for rarely Frequent UTI: Positive for historically.  Pain with urination: Negative Stream: Strong Urgency: No    GASTROINTESTINAL HISTORY: Type of bowel movement: (Bristol Stool Scale) 4-7 Frequency of BMs: 1-5x/day Incomplete bowel movement: Yes  Pain with defecation: Positive for active hemorrhoids Straining with defecation: Negative Hemorrhoids: Positive for internal & external; active/latent Fiber supplement: Yes  Incontinence: Positive for flatus and stool.  Triggers: dairy products, unknown (no apparent stress component), presence of loose stools  Amount: Min- Complete Loss.  SEXUAL HISTORY AND FUNCTION: Sexually active: Yes  Pain with penetration: None; recently evaluated for post-coital bleeding. Following with GYN ([facility] Pain with external stimulation: No    OBJECTIVE:  DIAGNOSTIC TESTING/IMAGING: 05/09/2022 CT:  IMPRESSION:  1. No acute findings in the abdomen pelvis.  2. Sigmoid colon diverticulosis without evidence diverticulitis.  3.  Aortic Atherosclerosis (ICD10-I70.0).   COGNITION:  Patient is oriented to person, place, and time.  Recent memory is intact.  Remote memory is intact.  Attention span and concentration are intact.  Expressive speech is intact.  Patient's fund of knowledge is within normal limits for educational level.    POSTURE/OBSERVATIONS:   Lumbar lordosis: WNL Thoracic kyphosis: mildly increased in angle and length  Iliac crest height: not formally  assessed  Lumbar lateral shift: not formally assessed  Pelvic obliquity: not formally assessed  Leg length discrepancy: not formally assessed    GAIT: Grossly WNL.   RANGE OF MOTION: deferred 2/2 to time constraints  AROM (Normal range in degrees) AROM  06/20/2022  Lumbar   Flexion (65)   Extension (30)   Right lateral flexion (25)   Left lateral flexion (25)   Right rotation (30)   Left rotation (30)       Hip LEFT RIGHT  Flexion (125)    Extension (15)    Abduction (40)    Adduction     Internal Rotation (45)    External Rotation (45)    (* = pain; blank rows = not tested)   SENSATION: deferred 2/2 to time constraints  Grossly intact to light touch bilateral LEs as determined by testing dermatomes L2-S2 Proprioception and hot/cold testing deferred on this date   STRENGTH: MMT deferred 2/2 to time constraints   RLE LLE  Hip Flexion    Hip Extension    Hip Abduction     Hip Adduction     Hip ER     Hip IR     Knee Extension    Knee Flexion    Dorsiflexion     Plantarflexion (seated)    (* = pain; blank rows = not tested)   MUSCLE LENGTH: deferred 2/2 to time constraints   ABDOMINAL: deferred 2/2 to time constraints  Palpation: Diastasis: Scar mobility: Rib flare:   SPECIAL TESTS: deferred 2/2 to time constraints  PALPATION: deferred 2/2 to time constraints  LOCATION LEFT   RIGHT           Lumbar paraspinals    Quadratus Lumborum    Gluteus Maximus    Gluteus Medius    Deep hip external rotators    PSIS    Fortin's Area (SIJ)    Greater Trochanter    ASIS    Sacral border    Coccyx    Ischial tuberosity    (blank rows = not tested) Graded on 0-4 scale (0 = no pain, 1 = pain, 2 = pain with wincing/grimacing/flinching, 3 = pain with withdrawal, 4 = unwilling to allow palpation)   PHYSICAL PERFORMANCE MEASURES:  STS: WNL Deep Squat: RLE STS: LLE STS:  6MWT: 5TSTS:     EXTERNAL PELVIC EXAM:  Patient educated on the purpose of the procedure/exam and articulated understanding and consented to the procedure/exam. and verbal  Breath coordination: present and aware Voluntary Contraction: present, 1/5 with hip adductor and abdominal compensatory patterns; testing performed in supine hooklying and sidelying. Not able to coordinate multiple sequential repetitions.  Relaxation: full Perineal movement with sustained IAP increase ("bear down"): descent Perineal movement with rapid IAP increase ("cough"): no change   INTERNAL VAGINAL EXAM:  None given; testing deferred to later date and Patient educated on the purpose of the procedure/exam and articulated understanding and consented to the procedure/exam.  Introitus Appears:  Skin integrity:  Scar mobility: Strength (PERF):  Symmetry: Palpation: Prolapse: (0 no contraction, 1 flicker, 2 weak squeeze and no lift, 3 fair squeeze and definite lift, 4 good squeeze and lift against resistance, 5 strong squeeze against strong resistance)   RECTAL EXAM:  None given; testing deferred to later date  Anal wink: present/absent Symmetry: Palpation: Strength (PERF):  (0 no contraction, 1 flicker, 2 weak squeeze and no lift, 3 fair squeeze and definite lift, 4 good squeeze and lift against resistance, 5 strong squeeze  against strong resistance)   PATIENT EDUCATION:  Patient educated on prognosis, POC, and  provided with HEP including: not initiated. Patient articulated understanding and returned demonstration. Patient will benefit from further education in order to maximize compliance and understanding for long-term therapeutic gains.   PATIENT SURVEYS:  FOTO Bowel 49  ASSESSMENT:  Clinical Impression: Patient is a 76 year old presenting to clinic with chief complaints of persistent accidental bowel leakage. Today's evaluation is suggestive of deficits in PFM strength and coordination as evidenced by 1/5 MMT PFM strength on testing with inconsistent ability to volitionally contract. Patient's responses on FOTO outcome measures (49) indicate significant functional limitations/disability/distress. Patient's progress may be limited due to persistence of complaint as well as stool consistency; however, patient's motivation is advantageous. Patient was able to achieve basic understanding of use of kegel weights for proprioceptive training of PFM during today's evaluation and responded positively to educational interventions. Patient will benefit from continued skilled therapeutic intervention to address deficits in PFM strength and coordination in order to increase function and improve overall QOL.   Objective impairments: decreased coordination, decreased endurance, decreased strength, improper body mechanics, and postural dysfunction.   Activity limitations: meal prep, cleaning, laundry, driving, and community activity.   Personal factors: Age, Past/current experiences, Time since onset of injury/illness/exacerbation, and 3+ comorbidities: osteopenia, Bradycardia, essential HTN, history of recurrent UTIs, HLD, atherosclerosis of abdominal aorta  are also affecting patient's functional outcome.   Rehab Potential: Fair    Clinical decision making: Evolving/moderate complexity  Evaluation complexity: Moderate   GOALS: Goals reviewed with patient? Yes  LONG TERM GOALS: Target date:  09/12/2022  Patient will demonstrate improved function as evidenced by a score of 61 on FOTO measure for full participation in activities at home and in the community.  Baseline: 49 Goal status: INITIAL  Patient will demonstrate independence with HEP in order to maximize therapeutic gains and improve carryover from physical therapy sessions to ADLs in the home and community. Baseline: not initiated Goal status: INITIAL  Patient will demonstrate circumferential and sequential contraction of >3/5 MMT, > 5 sec hold x5 and 5 consecutive quick flicks with </= 10 min rest between testing bouts, and relaxation of the PFM coordinated with breath for improved management of intra-abdominal pressure and normal bowel and bladder function without the presence of pain nor incontinence in order to improve participation at home and in the community. Baseline: 1/5 x1 rep Goal status: INITIAL    PLAN: Rehab frequency: 1x/week  Rehab duration: 12 weeks  Planned interventions: Therapeutic exercises, Therapeutic activity, Neuromuscular re-education, Balance training, Gait training, Patient/Family education, Joint mobilization, Orthotic/Fit training, Spinal mobilization, Cryotherapy, Moist heat, scar mobilization, Taping, and Manual therapy     Myles Gip PT, DPT [phone]  06/20/2022, 2:44 PM

## 2022-07-04 ENCOUNTER — Ambulatory Visit: Payer: Medicare HMO | Attending: Gastroenterology | Admitting: Physical Therapy

## 2022-07-04 ENCOUNTER — Encounter: Payer: Self-pay | Admitting: Physical Therapy

## 2022-07-04 DIAGNOSIS — M6281 Muscle weakness (generalized): Secondary | ICD-10-CM | POA: Insufficient documentation

## 2022-07-04 DIAGNOSIS — R278 Other lack of coordination: Secondary | ICD-10-CM | POA: Insufficient documentation

## 2022-07-04 NOTE — Therapy (Signed)
OUTPATIENT PHYSICAL THERAPY FEMALE PELVIC TREATMENT   Patient Name: Genifer Lazenby MRN: [id number] DOB:[birth year], 76 y.o., female Today's Date: 07/04/2022   PT End of Session - 07/04/22 0857     Visit Number 2    Number of Visits 12    Date for PT Re-Evaluation 09/12/22    Authorization Type IE 06/20/2022    PT Start Time 0900    PT Stop Time 0940    PT Time Calculation (min) 40 min    Activity Tolerance Patient tolerated treatment well    Behavior During Therapy [facility] for tasks assessed/performed             History reviewed. No pertinent past medical history. Past Surgical History:  Procedure Laterality Date   ABDOMINAL HYSTERECTOMY     partial   BREAST BIOPSY Right 11/25/2019   calcs, x marker,neg   BREAST BIOPSY Right 11/25/2019   calcs, ribbon marker, neg   LAPAROSCOPIC APPENDECTOMY     There are no problems to display for this patient.   PCP: Gayland Curry, MD  REFERRING PROVIDER: Ok Edwards, NP   REFERRING DIAG: [phone] (ICD-10-CM) - Irritable bowel syndrome with diarrhea M62.89 (ICD-10-CM) - Other specified disorders of muscle   THERAPY DIAG:  Other lack of coordination  Muscle weakness (generalized)  Rationale for Evaluation and Treatment Rehabilitation  PRECAUTIONS: None  WEIGHT BEARING RESTRICTIONS No  FALLS:  Has patient fallen in last 6 months? No  ONSET DATE: 2020-2021  SUBJECTIVE:                                                                                                                                                                                           Patient notes that she is still awaiting SIBO testing results. Patient states that she has adjusted dietary intake in both volume and triggers and had good results. Patient reports that she has vaginal weights and has been practicing while brushing her teeth; initially had trouble keeping weight placed and now is able to keep weight placed but has noticed  varying angles of muscle contraction and has had variable success.    PERTINENT HISTORY/CHART REVIEW:  Red flags (bowel/bladder changes, saddle paresthesia, personal history of cancer, h/o spinal tumors, h/o compression fx, h/o abdominal aneurysm, abdominal pain, chills/fever, night sweats, nausea, vomiting, unrelenting pain, first onset of insidious LBP <20 y/o): Negative  Last GI note: "Ms. Ignasiak was last seen on 04/29/22 for "diarrhea/llq pain; this is ongoing. Has had negative colon biopsy in past. 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 CT A/P. Daily psyllium for now, could consider some loperamide 74m qd -bid prn. Try the  exercises, considering sending back to pfpt. Follow up 6w sooner if problems." CT done as above. Fecal calprotectin and celiac unremarkable Since the last visit, was seen by gyn and had negative pap smear. Did note some atrophic vaginitis. States she is better than at last visit- states she is having less days with loose stools. Feels the psyllium has improved her stool consistency and frequency. Does still have some loose stools, ranges 1-4/d- the more she has the looser they get. There is sometimes still some urgency and fecal leakage but less than before. Denies any problems with abdominal pain/rectal bleeding, further gi concerns. Weight up a pound since last visit. She is not anemic. Colon cancer screening last 10/20 and had negative colon biopsies at that time. Has been treated for pelvic floor dysfunction with PT is past for same concerns, states is helped. Thinks she would like to do this again. We discussed her symptoms, lab/ct results, and their chronicity and the likelihood that she has IBSD and its management. Note she has had a few abdominal surgeries in past."    PAIN:  Are you having pain? No NPRS scale:  0/10 (current) Will have some LLQ pain when in the middle of a bowel flare up; defines this as soreness/fatigue.   OCCUPATION/LEISURE ACTIVITIES:   Golfing, hiking, tai chi   PLOF:  Independent  PATIENT GOALS: See if PFPT can offer any improvement  GASTROINTESTINAL HISTORY: Type of bowel movement: (Bristol Stool Scale) 4-7 Frequency of BMs: 1-5x/day Incomplete bowel movement: Yes  Pain with defecation: Positive for active hemorrhoids Straining with defecation: Negative Hemorrhoids: Positive for internal & external; active/latent Fiber supplement: Yes  Incontinence: Positive for flatus and stool.  Triggers: dairy products, unknown (no apparent stress component), presence of loose stools  Amount: Min- Complete Loss.   OBJECTIVE:  DIAGNOSTIC TESTING/IMAGING: 05/09/2022 CT: IMPRESSION:  1. No acute findings in the abdomen pelvis.  2. Sigmoid colon diverticulosis without evidence diverticulitis.  3.  Aortic Atherosclerosis (ICD10-I70.0).   EXTERNAL PELVIC EXAM:  Patient educated on the purpose of the procedure/exam and articulated understanding and consented to the procedure/exam. and verbal  Breath coordination: present and aware Voluntary Contraction: present, 1/5 with hip adductor and abdominal compensatory patterns; testing performed in supine hooklying and sidelying. Not able to coordinate multiple sequential repetitions.  Relaxation: full Perineal movement with sustained IAP increase ("bear down"): descent Perineal movement with rapid IAP increase ("cough"): no change  TREATMENT  Pre-treatment assessment:  Manual Therapy:   Neuromuscular Re-education: Patient educated on regression and progression of PFM coordination/motor control training with use of vaginal weights for improved success with HEP.  Patient educated on general dietary recommendations for thickening/bulking stool; provided with handout for improved bowel control.  Therapeutic Exercise:   Treatments unbilled:  Post-treatment assessment:  Patient educated throughout session on appropriate technique and form using multi-modal cueing, HEP, and  activity modification. Patient articulated understanding and returned demonstration.  Patient Response to interventions:     PATIENT SURVEYS:  FOTO Bowel 49  ASSESSMENT:  Clinical Impression: Patient presents to clinic with excellent motivation to participate in therapy. Patient demonstrates deficits in PFM strength and coordination. Patient able to achieve good understanding of proprioceptive training regressions and progressions with use of vaginal weights during today's session and responded positively to educational interventions. Patient will benefit from continued skilled therapeutic intervention to address remaining deficits in PFM strength and coordination in order to increase function and improve overall QOL.    Objective impairments: decreased coordination, decreased endurance, decreased  strength, improper body mechanics, and postural dysfunction.   Activity limitations: meal prep, cleaning, laundry, driving, and community activity.   Personal factors: Age, Past/current experiences, Time since onset of injury/illness/exacerbation, and 3+ comorbidities: osteopenia, Bradycardia, essential HTN, history of recurrent UTIs, HLD, atherosclerosis of abdominal aorta  are also affecting patient's functional outcome.   Rehab Potential: Fair    Clinical decision making: Evolving/moderate complexity  Evaluation complexity: Moderate   GOALS: Goals reviewed with patient? Yes  LONG TERM GOALS: Target date: 09/12/2022  Patient will demonstrate improved function as evidenced by a score of 61 on FOTO measure for full participation in activities at home and in the community.  Baseline: 49 Goal status: INITIAL  Patient will demonstrate independence with HEP in order to maximize therapeutic gains and improve carryover from physical therapy sessions to ADLs in the home and community. Baseline: not initiated Goal status: INITIAL  Patient will demonstrate circumferential and sequential  contraction of >3/5 MMT, > 5 sec hold x5 and 5 consecutive quick flicks with </= 10 min rest between testing bouts, and relaxation of the PFM coordinated with breath for improved management of intra-abdominal pressure and normal bowel and bladder function without the presence of pain nor incontinence in order to improve participation at home and in the community. Baseline: 1/5 x1 rep Goal status: INITIAL    PLAN: Rehab frequency: 1x/week  Rehab duration: 12 weeks  Planned interventions: Therapeutic exercises, Therapeutic activity, Neuromuscular re-education, Balance training, Gait training, Patient/Family education, Joint mobilization, Orthotic/Fit training, Spinal mobilization, Cryotherapy, Moist heat, scar mobilization, Taping, and Manual therapy     Myles Gip PT, DPT [phone]  07/04/2022, 8:57 AM

## 2022-07-11 ENCOUNTER — Ambulatory Visit: Payer: Medicare HMO | Admitting: Physical Therapy

## 2022-07-11 ENCOUNTER — Encounter: Payer: Self-pay | Admitting: Physical Therapy

## 2022-07-11 DIAGNOSIS — R278 Other lack of coordination: Secondary | ICD-10-CM | POA: Diagnosis not present

## 2022-07-11 DIAGNOSIS — M6281 Muscle weakness (generalized): Secondary | ICD-10-CM

## 2022-07-11 NOTE — Therapy (Signed)
OUTPATIENT PHYSICAL THERAPY FEMALE PELVIC TREATMENT   Patient Name: Prakriti Carignan MRN: [id number] DOB:04-29-1946, 76 y.o., female Today's Date: 07/11/2022   PT End of Session - 07/11/22 0858     Visit Number 3    Number of Visits 12    Date for PT Re-Evaluation 09/12/22    Authorization Type IE 06/20/2022    PT Start Time 0900    PT Stop Time 0940    PT Time Calculation (min) 40 min    Activity Tolerance Patient tolerated treatment well    Behavior During Therapy [facility] for tasks assessed/performed             History reviewed. No pertinent past medical history. Past Surgical History:  Procedure Laterality Date   ABDOMINAL HYSTERECTOMY     partial   BREAST BIOPSY Right 11/25/2019   calcs, x marker,neg   BREAST BIOPSY Right 11/25/2019   calcs, ribbon marker, neg   LAPAROSCOPIC APPENDECTOMY     There are no problems to display for this patient.   PCP: Gayland Curry, MD  REFERRING PROVIDER: Ok Edwards, NP   REFERRING DIAG: [phone] (ICD-10-CM) - Irritable bowel syndrome with diarrhea M62.89 (ICD-10-CM) - Other specified disorders of muscle   THERAPY DIAG:  Other lack of coordination  Muscle weakness (generalized)  Rationale for Evaluation and Treatment Rehabilitation  PRECAUTIONS: None  WEIGHT BEARING RESTRICTIONS No  FALLS:  Has patient fallen in last 6 months? No  ONSET DATE: 2020-2021  SUBJECTIVE:                                                                                                                                                                                           Patient notes that she is still awaiting SIBO testing results. Patient had lobster recently and feels the butter may have been an aggravating factor to bowels as well as a tomato. Patient has had as a result some LLQ pain/discomfort. Patient continues to use HEP. Patient has also applied body mechanics to urination to prevent post-void UI which has been  helpful.    PERTINENT HISTORY/CHART REVIEW:  Red flags (bowel/bladder changes, saddle paresthesia, personal history of cancer, h/o spinal tumors, h/o compression fx, h/o abdominal aneurysm, abdominal pain, chills/fever, night sweats, nausea, vomiting, unrelenting pain, first onset of insidious LBP <20 y/o): Negative  Last GI note: "Ms. Akkerman was last seen on 04/29/22 for "diarrhea/llq pain; this is ongoing. Has had negative colon biopsy in past. 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 CT A/P. Daily psyllium for now, could consider some loperamide 32m qd -bid prn. Try the exercises, considering sending back to pfpt. Follow up  6w sooner if problems." CT done as above. Fecal calprotectin and celiac unremarkable Since the last visit, was seen by gyn and had negative pap smear. Did note some atrophic vaginitis. States she is better than at last visit- states she is having less days with loose stools. Feels the psyllium has improved her stool consistency and frequency. Does still have some loose stools, ranges 1-4/d- the more she has the looser they get. There is sometimes still some urgency and fecal leakage but less than before. Denies any problems with abdominal pain/rectal bleeding, further gi concerns. Weight up a pound since last visit. She is not anemic. Colon cancer screening last 10/20 and had negative colon biopsies at that time. Has been treated for pelvic floor dysfunction with PT is past for same concerns, states is helped. Thinks she would like to do this again. We discussed her symptoms, lab/ct results, and their chronicity and the likelihood that she has IBSD and its management. Note she has had a few abdominal surgeries in past."    PAIN:  Are you having pain? No NPRS scale:  0/10 (current) Will have some LLQ pain when in the middle of a bowel flare up; defines this as soreness/fatigue.   OCCUPATION/LEISURE ACTIVITIES:  Golfing, hiking, tai chi   PLOF:  Independent  PATIENT  GOALS: See if PFPT can offer any improvement  GASTROINTESTINAL HISTORY: Type of bowel movement: (Bristol Stool Scale) 4-7 Frequency of BMs: 1-5x/day Incomplete bowel movement: Yes  Pain with defecation: Positive for active hemorrhoids Straining with defecation: Negative Hemorrhoids: Positive for internal & external; active/latent Fiber supplement: Yes  Incontinence: Positive for flatus and stool.  Triggers: dairy products, unknown (no apparent stress component), presence of loose stools  Amount: Min- Complete Loss.   OBJECTIVE:  DIAGNOSTIC TESTING/IMAGING: 05/09/2022 CT: IMPRESSION:  1. No acute findings in the abdomen pelvis.  2. Sigmoid colon diverticulosis without evidence diverticulitis.  3.  Aortic Atherosclerosis (ICD10-I70.0).   EXTERNAL PELVIC EXAM:  Patient educated on the purpose of the procedure/exam and articulated understanding and consented to the procedure/exam. and verbal  Breath coordination: present and aware Voluntary Contraction: present, 1/5 with hip adductor and abdominal compensatory patterns; testing performed in supine hooklying and sidelying. Not able to coordinate multiple sequential repetitions.  Relaxation: full Perineal movement with sustained IAP increase ("bear down"): descent Perineal movement with rapid IAP increase ("cough"): no change  TREATMENT  Pre-treatment assessment:  Manual Therapy:   Neuromuscular Re-education: Supine hooklying diaphragmatic breathing with VCs and TCs for downregulation of the nervous system and improved management of IAP Supine hooklying, TrA activation with exhalation. VCs and TCs to decrease compensatory patterns and minimize aggravation of the lumbar paraspinals. Deep core motor control activities: - Hooklying Small March  - 10 reps - Bent Knee Fallouts  - 10 reps Patient education on body mechanics training with vaginal weight for progression of home program.  Therapeutic Exercise:   Treatments  unbilled:  Post-treatment assessment:  Patient educated throughout session on appropriate technique and form using multi-modal cueing, HEP, and activity modification. Patient articulated understanding and returned demonstration.  Patient Response to interventions:     PATIENT SURVEYS:  FOTO Bowel 49  ASSESSMENT:  Clinical Impression: Patient presents to clinic with excellent motivation to participate in therapy. Patient demonstrates deficits in PFM strength and coordination. Patient able to adequately contract TrA in supine hooklying position with coordinated breath with and without LE challenge during today's session and responded positively to educational interventions. Patient will benefit from continued skilled therapeutic  intervention to address remaining deficits in PFM strength and coordination in order to increase function and improve overall QOL.    Objective impairments: decreased coordination, decreased endurance, decreased strength, improper body mechanics, and postural dysfunction.   Activity limitations: meal prep, cleaning, laundry, driving, and community activity.   Personal factors: Age, Past/current experiences, Time since onset of injury/illness/exacerbation, and 3+ comorbidities: osteopenia, Bradycardia, essential HTN, history of recurrent UTIs, HLD, atherosclerosis of abdominal aorta  are also affecting patient's functional outcome.   Rehab Potential: Fair    Clinical decision making: Evolving/moderate complexity  Evaluation complexity: Moderate   GOALS: Goals reviewed with patient? Yes  LONG TERM GOALS: Target date: 09/12/2022  Patient will demonstrate improved function as evidenced by a score of 61 on FOTO measure for full participation in activities at home and in the community.  Baseline: 49 Goal status: INITIAL  Patient will demonstrate independence with HEP in order to maximize therapeutic gains and improve carryover from physical therapy sessions  to ADLs in the home and community. Baseline: not initiated Goal status: INITIAL  Patient will demonstrate circumferential and sequential contraction of >3/5 MMT, > 5 sec hold x5 and 5 consecutive quick flicks with </= 10 min rest between testing bouts, and relaxation of the PFM coordinated with breath for improved management of intra-abdominal pressure and normal bowel and bladder function without the presence of pain nor incontinence in order to improve participation at home and in the community. Baseline: 1/5 x1 rep Goal status: INITIAL    PLAN: Rehab frequency: 1x/week  Rehab duration: 12 weeks  Planned interventions: Therapeutic exercises, Therapeutic activity, Neuromuscular re-education, Balance training, Gait training, Patient/Family education, Joint mobilization, Orthotic/Fit training, Spinal mobilization, Cryotherapy, Moist heat, scar mobilization, Taping, and Manual therapy     Myles Gip PT, DPT [phone]  07/11/2022, 8:59 AM

## 2022-07-18 ENCOUNTER — Encounter: Payer: Self-pay | Admitting: Physical Therapy

## 2022-07-18 ENCOUNTER — Ambulatory Visit: Payer: Medicare HMO | Admitting: Physical Therapy

## 2022-07-18 DIAGNOSIS — M6281 Muscle weakness (generalized): Secondary | ICD-10-CM

## 2022-07-18 DIAGNOSIS — R278 Other lack of coordination: Secondary | ICD-10-CM | POA: Diagnosis not present

## 2022-07-18 NOTE — Therapy (Signed)
OUTPATIENT PHYSICAL THERAPY FEMALE PELVIC TREATMENT   Patient Name: Tammie Phillips MRN: [id number] DOB:05/20/1946, 76 y.o., female Today's Date: 07/18/2022   PT End of Session - 07/18/22 0902     Visit Number 4    Number of Visits 12    Date for PT Re-Evaluation 09/12/22    Authorization Type IE 06/20/2022    PT Start Time 0900    PT Stop Time 0940    PT Time Calculation (min) 40 min    Activity Tolerance Patient tolerated treatment well    Behavior During Therapy [facility] for tasks assessed/performed             History reviewed. No pertinent past medical history. Past Surgical History:  Procedure Laterality Date   ABDOMINAL HYSTERECTOMY     partial   BREAST BIOPSY Right 11/25/2019   calcs, x marker,neg   BREAST BIOPSY Right 11/25/2019   calcs, ribbon marker, neg   LAPAROSCOPIC APPENDECTOMY     There are no problems to display for this patient.   PCP: Gayland Curry, MD  REFERRING PROVIDER: Ok Edwards, NP   REFERRING DIAG: [phone] (ICD-10-CM) - Irritable bowel syndrome with diarrhea M62.89 (ICD-10-CM) - Other specified disorders of muscle   THERAPY DIAG:  Other lack of coordination  Muscle weakness (generalized)  Rationale for Evaluation and Treatment Rehabilitation  PRECAUTIONS: None  WEIGHT BEARING RESTRICTIONS No  FALLS:  Has patient fallen in last 6 months? No  ONSET DATE: 2020-2021  SUBJECTIVE:                                                                                                                                                                                           Patient states that she got results back from SIBO test and there was nothing revealing/concerning. Patient notes that she has been doing well, but does continue to have a weekly flare up of symptoms including hemorrhoid aggravation as well as fecal urgency and frequency.    PERTINENT HISTORY/CHART REVIEW:  Red flags (bowel/bladder changes, saddle  paresthesia, personal history of cancer, h/o spinal tumors, h/o compression fx, h/o abdominal aneurysm, abdominal pain, chills/fever, night sweats, nausea, vomiting, unrelenting pain, first onset of insidious LBP <20 y/o): Negative  Last GI note: "Ms. Korte was last seen on 04/29/22 for "diarrhea/llq pain; this is ongoing. Has had negative colon biopsy in past. 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 CT A/P. Daily psyllium for now, could consider some loperamide 22m qd -bid prn. Try the exercises, considering sending back to pfpt. Follow up 6w sooner if problems." CT done as above. Fecal calprotectin and celiac unremarkable Since the last visit,  was seen by gyn and had negative pap smear. Did note some atrophic vaginitis. States she is better than at last visit- states she is having less days with loose stools. Feels the psyllium has improved her stool consistency and frequency. Does still have some loose stools, ranges 1-4/d- the more she has the looser they get. There is sometimes still some urgency and fecal leakage but less than before. Denies any problems with abdominal pain/rectal bleeding, further gi concerns. Weight up a pound since last visit. She is not anemic. Colon cancer screening last 10/20 and had negative colon biopsies at that time. Has been treated for pelvic floor dysfunction with PT is past for same concerns, states is helped. Thinks she would like to do this again. We discussed her symptoms, lab/ct results, and their chronicity and the likelihood that she has IBSD and its management. Note she has had a few abdominal surgeries in past."    PAIN:  Are you having pain? No NPRS scale:  0/10 (current) Will have some LLQ pain when in the middle of a bowel flare up; defines this as soreness/fatigue.   OCCUPATION/LEISURE ACTIVITIES:  Golfing, hiking, tai chi   PLOF:  Independent  PATIENT GOALS: See if PFPT can offer any improvement  GASTROINTESTINAL HISTORY: Type of bowel  movement: (Bristol Stool Scale) 4-7 Frequency of BMs: 1-5x/day Incomplete bowel movement: Yes  Pain with defecation: Positive for active hemorrhoids Straining with defecation: Negative Hemorrhoids: Positive for internal & external; active/latent Fiber supplement: Yes  Incontinence: Positive for flatus and stool.  Triggers: dairy products, unknown (no apparent stress component), presence of loose stools  Amount: Min- Complete Loss.   OBJECTIVE:  DIAGNOSTIC TESTING/IMAGING: 05/09/2022 CT: IMPRESSION:  1. No acute findings in the abdomen pelvis.  2. Sigmoid colon diverticulosis without evidence diverticulitis.  3.  Aortic Atherosclerosis (ICD10-I70.0).   EXTERNAL PELVIC EXAM:  Patient educated on the purpose of the procedure/exam and articulated understanding and consented to the procedure/exam. and verbal  Breath coordination: present and aware Voluntary Contraction: present, 1/5 with hip adductor and abdominal compensatory patterns; testing performed in supine hooklying and sidelying. Not able to coordinate multiple sequential repetitions.  Relaxation: full Perineal movement with sustained IAP increase ("bear down"): descent Perineal movement with rapid IAP increase ("cough"): no change  TREATMENT  Pre-treatment assessment:  Manual Therapy:   Neuromuscular Re-education: Patient education on symptom management including: urge suppression strategies, working with dietician to determine food triggers, slow/gradual behavior change to allow for sustainable, long-term management.   Therapeutic Exercise:   Treatments unbilled:  Post-treatment assessment:  Patient educated throughout session on appropriate technique and form using multi-modal cueing, HEP, and activity modification. Patient articulated understanding and returned demonstration.  Patient Response to interventions:     PATIENT SURVEYS:  FOTO Bowel 49  ASSESSMENT:  Clinical Impression: Patient presents to  clinic with excellent motivation to participate in therapy. Patient demonstrates deficits in PFM strength and coordination. Patient receptive to education on symptom management and urge suppression strategies during today's session and notes comfort with continuing to progress HEP as tolerated. Patient will benefit from continued skilled therapeutic intervention to address remaining deficits in PFM strength and coordination in order to increase function and improve overall QOL.    Objective impairments: decreased coordination, decreased endurance, decreased strength, improper body mechanics, and postural dysfunction.   Activity limitations: meal prep, cleaning, laundry, driving, and community activity.   Personal factors: Age, Past/current experiences, Time since onset of injury/illness/exacerbation, and 3+ comorbidities: osteopenia, Bradycardia, essential HTN, history  of recurrent UTIs, HLD, atherosclerosis of abdominal aorta  are also affecting patient's functional outcome.   Rehab Potential: Fair    Clinical decision making: Evolving/moderate complexity  Evaluation complexity: Moderate   GOALS: Goals reviewed with patient? Yes  LONG TERM GOALS: Target date: 09/12/2022  Patient will demonstrate improved function as evidenced by a score of 61 on FOTO measure for full participation in activities at home and in the community.  Baseline: 49 Goal status: INITIAL  Patient will demonstrate independence with HEP in order to maximize therapeutic gains and improve carryover from physical therapy sessions to ADLs in the home and community. Baseline: not initiated Goal status: INITIAL  Patient will demonstrate circumferential and sequential contraction of >3/5 MMT, > 5 sec hold x5 and 5 consecutive quick flicks with </= 10 min rest between testing bouts, and relaxation of the PFM coordinated with breath for improved management of intra-abdominal pressure and normal bowel and bladder function  without the presence of pain nor incontinence in order to improve participation at home and in the community. Baseline: 1/5 x1 rep Goal status: INITIAL    PLAN: Rehab frequency: 1x/week  Rehab duration: 12 weeks  Planned interventions: Therapeutic exercises, Therapeutic activity, Neuromuscular re-education, Balance training, Gait training, Patient/Family education, Joint mobilization, Orthotic/Fit training, Spinal mobilization, Cryotherapy, Moist heat, scar mobilization, Taping, and Manual therapy     Myles Gip PT, DPT [phone]  07/18/2022, 9:03 AM

## 2022-07-25 ENCOUNTER — Encounter: Payer: Self-pay | Admitting: Physical Therapy

## 2022-07-25 ENCOUNTER — Ambulatory Visit: Payer: Medicare HMO | Attending: Gastroenterology | Admitting: Physical Therapy

## 2022-07-25 DIAGNOSIS — M6281 Muscle weakness (generalized): Secondary | ICD-10-CM | POA: Insufficient documentation

## 2022-07-25 DIAGNOSIS — R278 Other lack of coordination: Secondary | ICD-10-CM | POA: Insufficient documentation

## 2022-07-25 NOTE — Therapy (Signed)
OUTPATIENT PHYSICAL THERAPY FEMALE PELVIC TREATMENT   Patient Name: Amit Meloy MRN: [id number] DOB:[birth year], 76 y.o., female Today's Date: 07/25/2022   PT End of Session - 07/25/22 0901     Visit Number 5    Number of Visits 12    Date for PT Re-Evaluation 09/12/22    Authorization Type IE 06/20/2022    PT Start Time 0900    PT Stop Time 0940    PT Time Calculation (min) 40 min    Activity Tolerance Patient tolerated treatment well    Behavior During Therapy [facility] for tasks assessed/performed             History reviewed. No pertinent past medical history. Past Surgical History:  Procedure Laterality Date   ABDOMINAL HYSTERECTOMY     partial   BREAST BIOPSY Right 11/25/2019   calcs, x marker,neg   BREAST BIOPSY Right 11/25/2019   calcs, ribbon marker, neg   LAPAROSCOPIC APPENDECTOMY     There are no problems to display for this patient.   PCP: Gayland Curry, MD  REFERRING PROVIDER: Ok Edwards, NP   REFERRING DIAG: [phone] (ICD-10-CM) - Irritable bowel syndrome with diarrhea M62.89 (ICD-10-CM) - Other specified disorders of muscle   THERAPY DIAG:  Other lack of coordination  Muscle weakness (generalized)  Rationale for Evaluation and Treatment Rehabilitation  PRECAUTIONS: None  WEIGHT BEARING RESTRICTIONS No  FALLS:  Has patient fallen in last 6 months? No  ONSET DATE: 2020-2021  SUBJECTIVE:                                                                                                                                                                                           Patient reports that she has been doing well this past week. Patient has tried urge suppression for fecal urge with some success. Patient did note that she had some stomach upset with consumption of white bean soup. Patient also articulating some correlation between stress and bowel concerns.    PERTINENT HISTORY/CHART REVIEW:  Red flags (bowel/bladder  changes, saddle paresthesia, personal history of cancer, h/o spinal tumors, h/o compression fx, h/o abdominal aneurysm, abdominal pain, chills/fever, night sweats, nausea, vomiting, unrelenting pain, first onset of insidious LBP <20 y/o): Negative  Last GI note: "Ms. Cwynar was last seen on 04/29/22 for "diarrhea/llq pain; this is ongoing. Has had negative colon biopsy in past. 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 CT A/P. Daily psyllium for now, could consider some loperamide 57m qd -bid prn. Try the exercises, considering sending back to pfpt. Follow up 6w sooner if problems." CT done as above. Fecal calprotectin and celiac unremarkable Since  the last visit, was seen by gyn and had negative pap smear. Did note some atrophic vaginitis. States she is better than at last visit- states she is having less days with loose stools. Feels the psyllium has improved her stool consistency and frequency. Does still have some loose stools, ranges 1-4/d- the more she has the looser they get. There is sometimes still some urgency and fecal leakage but less than before. Denies any problems with abdominal pain/rectal bleeding, further gi concerns. Weight up a pound since last visit. She is not anemic. Colon cancer screening last 10/20 and had negative colon biopsies at that time. Has been treated for pelvic floor dysfunction with PT is past for same concerns, states is helped. Thinks she would like to do this again. We discussed her symptoms, lab/ct results, and their chronicity and the likelihood that she has IBSD and its management. Note she has had a few abdominal surgeries in past."    PAIN:  Are you having pain? No NPRS scale:  0/10 (current) Will have some LLQ pain when in the middle of a bowel flare up; defines this as soreness/fatigue.   OCCUPATION/LEISURE ACTIVITIES:  Golfing, hiking, tai chi   PLOF:  Independent  PATIENT GOALS: See if PFPT can offer any improvement  GASTROINTESTINAL  HISTORY: Type of bowel movement: (Bristol Stool Scale) 4-7 Frequency of BMs: 1-5x/day Incomplete bowel movement: Yes  Pain with defecation: Positive for active hemorrhoids Straining with defecation: Negative Hemorrhoids: Positive for internal & external; active/latent Fiber supplement: Yes  Incontinence: Positive for flatus and stool.  Triggers: dairy products, unknown (no apparent stress component), presence of loose stools  Amount: Min- Complete Loss.   OBJECTIVE:  DIAGNOSTIC TESTING/IMAGING: 05/09/2022 CT: IMPRESSION:  1. No acute findings in the abdomen pelvis.  2. Sigmoid colon diverticulosis without evidence diverticulitis.  3.  Aortic Atherosclerosis (ICD10-I70.0).   EXTERNAL PELVIC EXAM:  Patient educated on the purpose of the procedure/exam and articulated understanding and consented to the procedure/exam. and verbal  Breath coordination: present and aware Voluntary Contraction: present, 1/5 with hip adductor and abdominal compensatory patterns; testing performed in supine hooklying and sidelying. Not able to coordinate multiple sequential repetitions.  Relaxation: full Perineal movement with sustained IAP increase ("bear down"): descent Perineal movement with rapid IAP increase ("cough"): no change  TREATMENT  Pre-treatment assessment:  Manual Therapy:   Neuromuscular Re-education: Patient education on nervous system downregulation strategies including: working with licensed mental health provider, diaphragmatic breathing, focusing on things within locus of control and letting go of other things. Supine hooklying diaphragmatic breathing with VCs and TCs for downregulation of the nervous system and improved management of IAP Supine hooklying, PFM contractions with exhalation. VCs and TCs to decrease compensatory patterns and encourage activation of the PFM.    Therapeutic Exercise:   Treatments unbilled:  Post-treatment assessment:  Patient educated  throughout session on appropriate technique and form using multi-modal cueing, HEP, and activity modification. Patient articulated understanding and returned demonstration.  Patient Response to interventions: Comfortable to continue with HEP until return from trip.    PATIENT SURVEYS:  FOTO Bowel 49  ASSESSMENT:  Clinical Impression: Patient presents to clinic with excellent motivation to participate in therapy. Patient demonstrates deficits in PFM strength and coordination. Patient with improved circumferential and sequential PFM contraction for 5 repetitions in supine during today's session suggesting increased motor control and power in the PFM despite continued deficits. Patient will benefit from continued skilled therapeutic intervention to address remaining deficits in PFM strength and coordination  in order to increase function and improve overall QOL.    Objective impairments: decreased coordination, decreased endurance, decreased strength, improper body mechanics, and postural dysfunction.   Activity limitations: meal prep, cleaning, laundry, driving, and community activity.   Personal factors: Age, Past/current experiences, Time since onset of injury/illness/exacerbation, and 3+ comorbidities: osteopenia, Bradycardia, essential HTN, history of recurrent UTIs, HLD, atherosclerosis of abdominal aorta  are also affecting patient's functional outcome.   Rehab Potential: Fair    Clinical decision making: Evolving/moderate complexity  Evaluation complexity: Moderate   GOALS: Goals reviewed with patient? Yes  LONG TERM GOALS: Target date: 09/12/2022  Patient will demonstrate improved function as evidenced by a score of 61 on FOTO measure for full participation in activities at home and in the community.  Baseline: 49 Goal status: INITIAL  Patient will demonstrate independence with HEP in order to maximize therapeutic gains and improve carryover from physical therapy sessions to  ADLs in the home and community. Baseline: not initiated Goal status: INITIAL  Patient will demonstrate circumferential and sequential contraction of >3/5 MMT, > 5 sec hold x5 and 5 consecutive quick flicks with </= 10 min rest between testing bouts, and relaxation of the PFM coordinated with breath for improved management of intra-abdominal pressure and normal bowel and bladder function without the presence of pain nor incontinence in order to improve participation at home and in the community. Baseline: 1/5 x1 rep Goal status: INITIAL    PLAN: Rehab frequency: 1x/week  Rehab duration: 12 weeks  Planned interventions: Therapeutic exercises, Therapeutic activity, Neuromuscular re-education, Balance training, Gait training, Patient/Family education, Joint mobilization, Orthotic/Fit training, Spinal mobilization, Cryotherapy, Moist heat, scar mobilization, Taping, and Manual therapy     Myles Gip PT, DPT [phone]  07/25/2022, 9:02 AM

## 2022-07-30 ENCOUNTER — Encounter: Payer: Medicare HMO | Admitting: Physical Therapy

## 2022-08-01 ENCOUNTER — Encounter: Payer: Medicare HMO | Admitting: Physical Therapy

## 2022-08-22 ENCOUNTER — Ambulatory Visit: Payer: Medicare HMO | Admitting: Physical Therapy

## 2022-08-22 ENCOUNTER — Encounter: Payer: Self-pay | Admitting: Physical Therapy

## 2022-08-22 DIAGNOSIS — M6281 Muscle weakness (generalized): Secondary | ICD-10-CM

## 2022-08-22 DIAGNOSIS — R278 Other lack of coordination: Secondary | ICD-10-CM

## 2022-08-22 NOTE — Therapy (Signed)
OUTPATIENT PHYSICAL THERAPY FEMALE PELVIC TREATMENT   Patient Name: Tammie Phillips MRN: [id number] DOB:Jan 19, 1946, 76 y.o., female Today's Date: 08/22/2022   PT End of Session - 08/22/22 0856     Visit Number 6    Number of Visits 12    Date for PT Re-Evaluation 09/12/22    Authorization Type IE 06/20/2022    PT Start Time 0900    PT Stop Time 0940    PT Time Calculation (min) 40 min    Activity Tolerance Patient tolerated treatment well    Behavior During Therapy [facility] for tasks assessed/performed             History reviewed. No pertinent past medical history. Past Surgical History:  Procedure Laterality Date   ABDOMINAL HYSTERECTOMY     partial   BREAST BIOPSY Right 11/25/2019   calcs, x marker,neg   BREAST BIOPSY Right 11/25/2019   calcs, ribbon marker, neg   LAPAROSCOPIC APPENDECTOMY     There are no problems to display for this patient.   PCP: Tammie Curry, MD  REFERRING PROVIDER: Ok Edwards, NP   REFERRING DIAG: [phone] (ICD-10-CM) - Irritable bowel syndrome with diarrhea M62.89 (ICD-10-CM) - Other specified disorders of muscle   THERAPY DIAG:  Other lack of coordination  Muscle weakness (generalized)  Rationale for Evaluation and Treatment Rehabilitation  PRECAUTIONS: None  WEIGHT BEARING RESTRICTIONS No  FALLS:  Has patient fallen in last 6 months? No  ONSET DATE: 2020-2021  SUBJECTIVE:                                                                                                                                                                                           Patient notes that her trip to [place] was not without incident, but that her symptoms were much better managed this year as compared to last year. Patient continues to deal FI but feels things are under better control. Now that she is getting back into her routine, she is hoping to add in more strengthening activities. Patient has consistently worked on PFM  proprioception with breathing exercises.    PERTINENT HISTORY/CHART REVIEW:  Red flags (bowel/bladder changes, saddle paresthesia, personal history of cancer, h/o spinal tumors, h/o compression fx, h/o abdominal aneurysm, abdominal pain, chills/fever, night sweats, nausea, vomiting, unrelenting pain, first onset of insidious LBP <20 y/o): Negative  Last GI note: "Tammie Phillips was last seen on 04/29/22 for "diarrhea/llq pain; this is ongoing. Has had negative colon biopsy in past. 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 CT A/P. Daily psyllium for now, could consider some loperamide 105m qd -bid prn. Try the exercises, considering sending  back to pfpt. Follow up 6w sooner if problems." CT done as above. Fecal calprotectin and celiac unremarkable Since the last visit, was seen by gyn and had negative pap smear. Did note some atrophic vaginitis. States she is better than at last visit- states she is having less days with loose stools. Feels the psyllium has improved her stool consistency and frequency. Does still have some loose stools, ranges 1-4/d- the more she has the looser they get. There is sometimes still some urgency and fecal leakage but less than before. Denies any problems with abdominal pain/rectal bleeding, further gi concerns. Weight up a pound since last visit. She is not anemic. Colon cancer screening last 10/20 and had negative colon biopsies at that time. Has been treated for pelvic floor dysfunction with PT is past for same concerns, states is helped. Thinks she would like to do this again. We discussed her symptoms, lab/ct results, and their chronicity and the likelihood that she has IBSD and its management. Note she has had a few abdominal surgeries in past."    PAIN:  Are you having pain? No NPRS scale:  0/10 (current) Will have some LLQ pain when in the middle of a bowel flare up; defines this as soreness/fatigue.   OCCUPATION/LEISURE ACTIVITIES:  Golfing, hiking, tai chi    PLOF:  Independent  PATIENT GOALS: See if PFPT can offer any improvement  GASTROINTESTINAL HISTORY: Type of bowel movement: (Bristol Stool Scale) 4-7 Frequency of BMs: 1-5x/day Incomplete bowel movement: Yes  Pain with defecation: Positive for active hemorrhoids Straining with defecation: Negative Hemorrhoids: Positive for internal & external; active/latent Fiber supplement: Yes  Incontinence: Positive for flatus and stool.  Triggers: dairy products, unknown (no apparent stress component), presence of loose stools  Amount: Min- Complete Loss.   OBJECTIVE:  DIAGNOSTIC TESTING/IMAGING: 05/09/2022 CT: IMPRESSION:  1. No acute findings in the abdomen pelvis.  2. Sigmoid colon diverticulosis without evidence diverticulitis.  3.  Aortic Atherosclerosis (ICD10-I70.0).   EXTERNAL PELVIC EXAM:  Patient educated on the purpose of the procedure/exam and articulated understanding and consented to the procedure/exam. and verbal  Breath coordination: present and aware Voluntary Contraction: present, 1/5 with hip adductor and abdominal compensatory patterns; testing performed in supine hooklying and sidelying. Not able to coordinate multiple sequential repetitions.  Relaxation: full Perineal movement with sustained IAP increase ("bear down"): descent Perineal movement with rapid IAP increase ("cough"): no change  TREATMENT  Pre-treatment assessment:  Manual Therapy:   Neuromuscular Re-education: Reassessed goals; see below. Supine hooklying diaphragmatic breathing with VCs and TCs for downregulation of the nervous system and improved management of IAP Supine hooklying, PFM contractions with exhalation. VCs and TCs to decrease compensatory patterns and encourage activation of the PFM.    Therapeutic Exercise:   Treatments unbilled:  Post-treatment assessment:  Patient educated throughout session on appropriate technique and form using multi-modal cueing, HEP, and activity  modification. Patient articulated understanding and returned demonstration.  Patient Response to interventions: Comfortable to return after f/u with GI    PATIENT SURVEYS:  FOTO Bowel 49  ASSESSMENT:  Clinical Impression: Patient presents to clinic with excellent motivation to participate in therapy. Patient demonstrates deficits in PFM strength and coordination. Patient has maintained improved motor control despite recent travel and hiatus from PT, and subjectively reports a mild improvement in symptoms during today's session. Patient will benefit from continued skilled therapeutic intervention to address remaining deficits in PFM strength and coordination in order to increase function and improve overall QOL.  Objective impairments: decreased coordination, decreased endurance, decreased strength, improper body mechanics, and postural dysfunction.   Activity limitations: meal prep, cleaning, laundry, driving, and community activity.   Personal factors: Age, Past/current experiences, Time since onset of injury/illness/exacerbation, and 3+ comorbidities: osteopenia, Bradycardia, essential HTN, history of recurrent UTIs, HLD, atherosclerosis of abdominal aorta  are also affecting patient's functional outcome.   Rehab Potential: Fair    Clinical decision making: Evolving/moderate complexity  Evaluation complexity: Moderate   GOALS: Goals reviewed with patient? Yes  LONG TERM GOALS: Target date: 09/12/2022  Patient will demonstrate improved function as evidenced by a score of 61 on FOTO measure for full participation in activities at home and in the community.  Baseline: 49; 8/31: 50 Goal status: IN PROGRESS  Patient will demonstrate independence with HEP in order to maximize therapeutic gains and improve carryover from physical therapy sessions to ADLs in the home and community. Baseline: not initiated; 8/31: breathing exercises IND, strengthening exercises 80% Goal status:  INITIAL  Patient will demonstrate circumferential and sequential contraction of >3/5 MMT, > 5 sec hold x5 and 5 consecutive quick flicks with </= 10 min rest between testing bouts, and relaxation of the PFM coordinated with breath for improved management of intra-abdominal pressure and normal bowel and bladder function without the presence of pain nor incontinence in order to improve participation at home and in the community. Baseline: 1/5 x1 rep; 8/31: 1/5, x 3 reps Goal status: IN PROGRESS    PLAN: Rehab frequency: 1x/week  Rehab duration: 12 weeks  Planned interventions: Therapeutic exercises, Therapeutic activity, Neuromuscular re-education, Balance training, Gait training, Patient/Family education, Joint mobilization, Orthotic/Fit training, Spinal mobilization, Cryotherapy, Moist heat, scar mobilization, Taping, and Manual therapy     Myles Gip PT, DPT [phone]  08/22/2022, 8:56 AM

## 2022-08-25 IMAGING — MG MM DIGITAL DIAGNOSTIC UNILAT*L* W/ TOMO W/ CAD
4 series · 4 of 8 positions shown · non-contrast
Comparison: Previous exams including recent screening mammogram
dated 05/08/2022.

CLINICAL DATA: Patient returns today to evaluate LEFT breast
calcifications identified on a recent screening mammogram.

EXAM:
DIGITAL DIAGNOSTIC UNILATERAL LEFT MAMMOGRAM WITH TOMOSYNTHESIS AND
CAD
TECHNIQUE: Left digital diagnostic mammography and breast tomosynthesis was
performed. The images were evaluated with computer-aided detection.

[L CC]
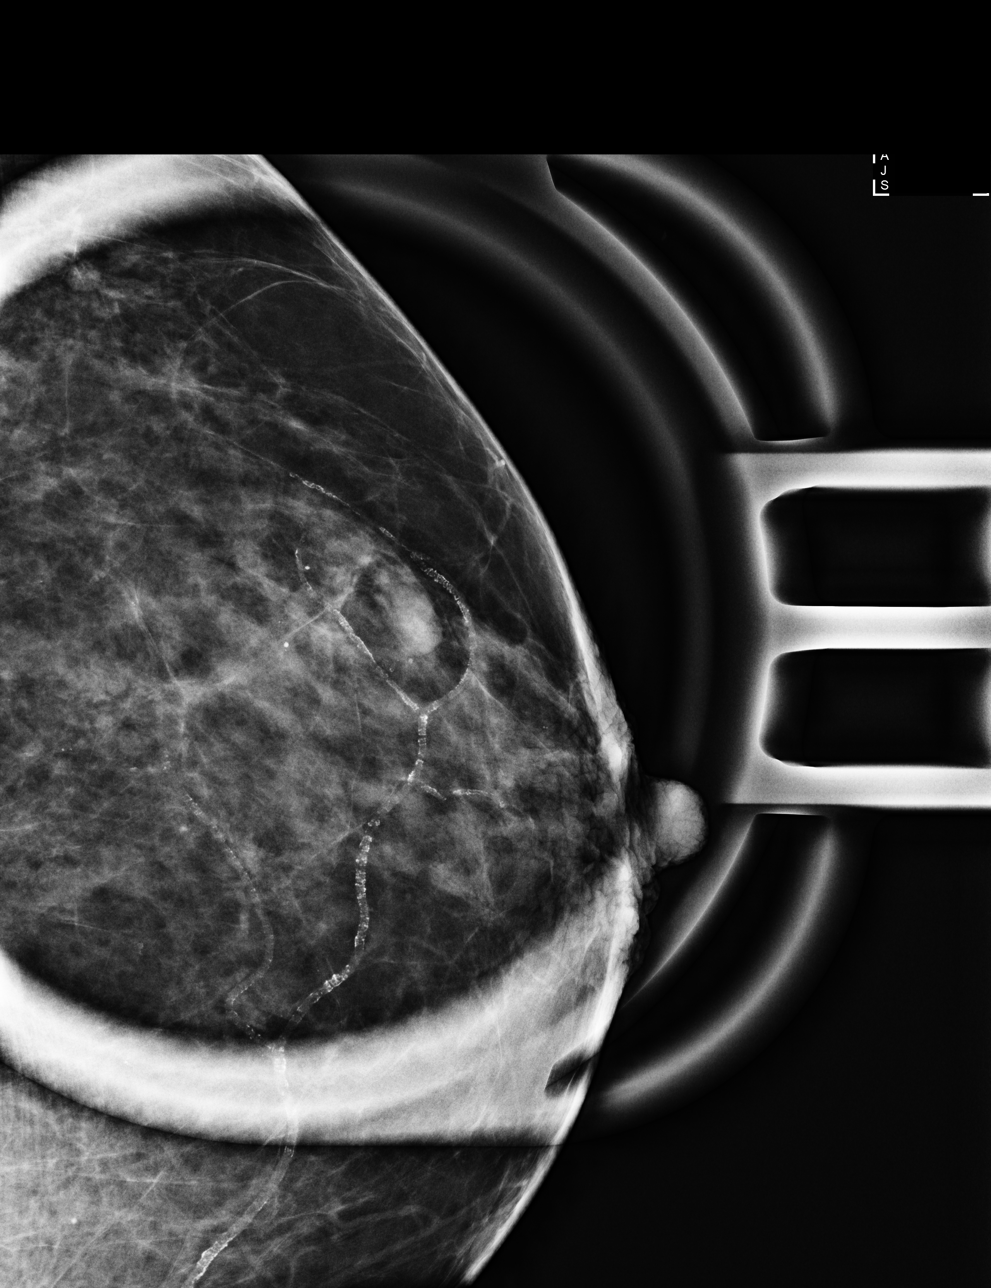

[L ML]
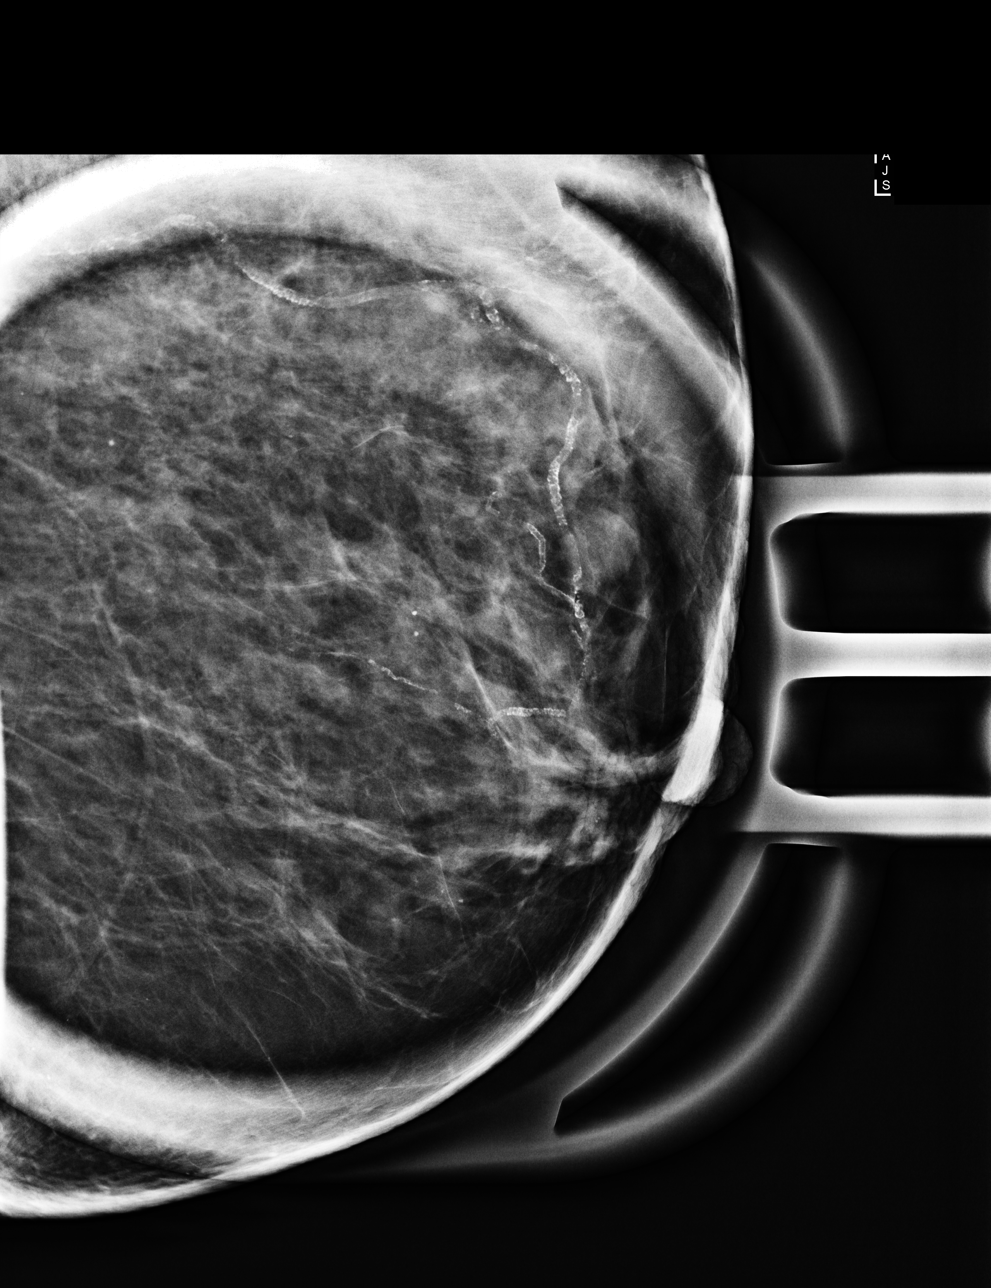

[L ML synth-2D]
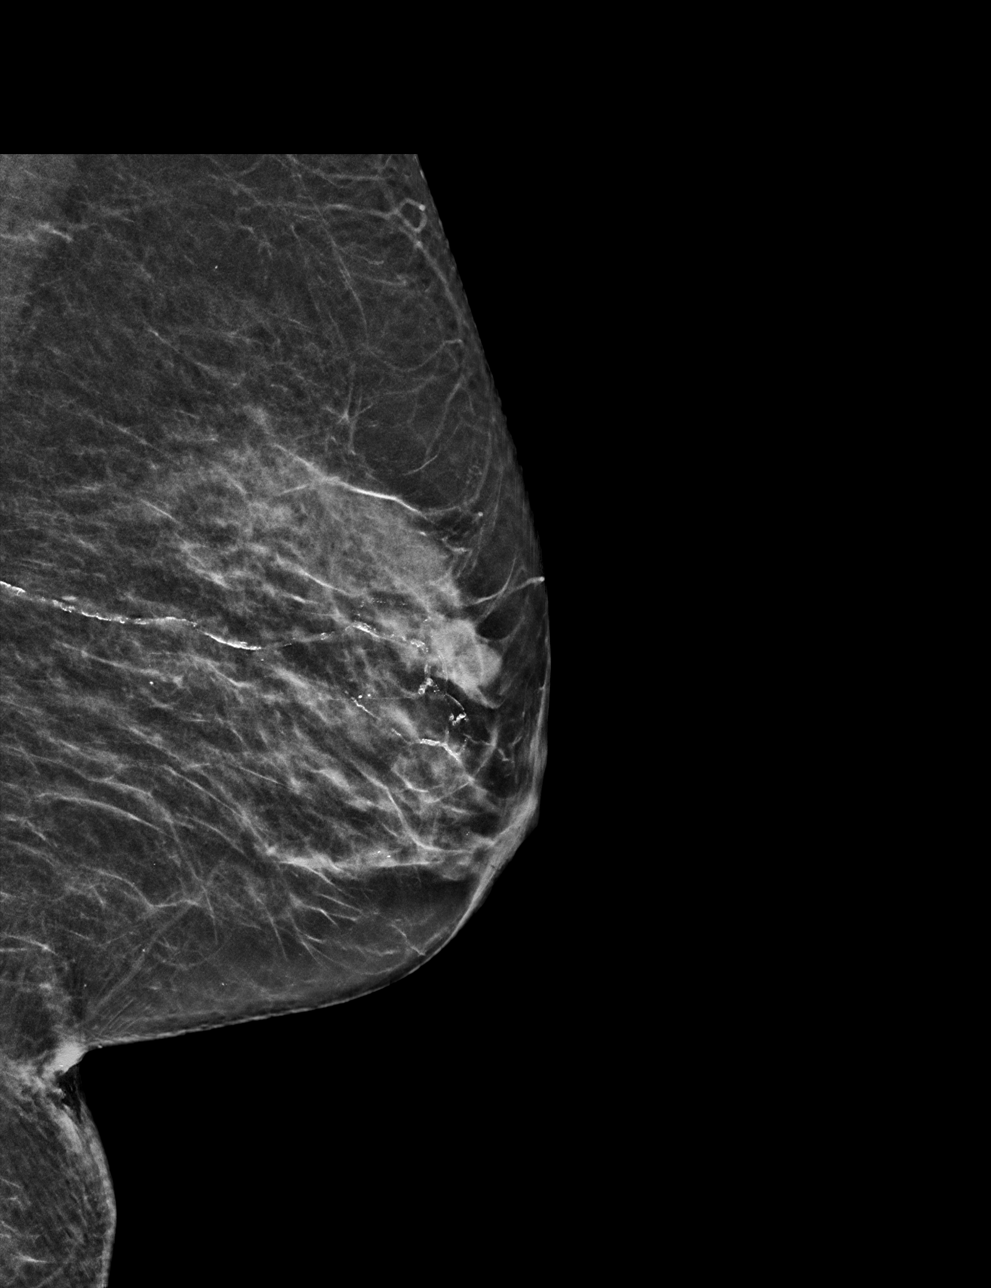

[L ML tomo · tomo slice 26/51.0]
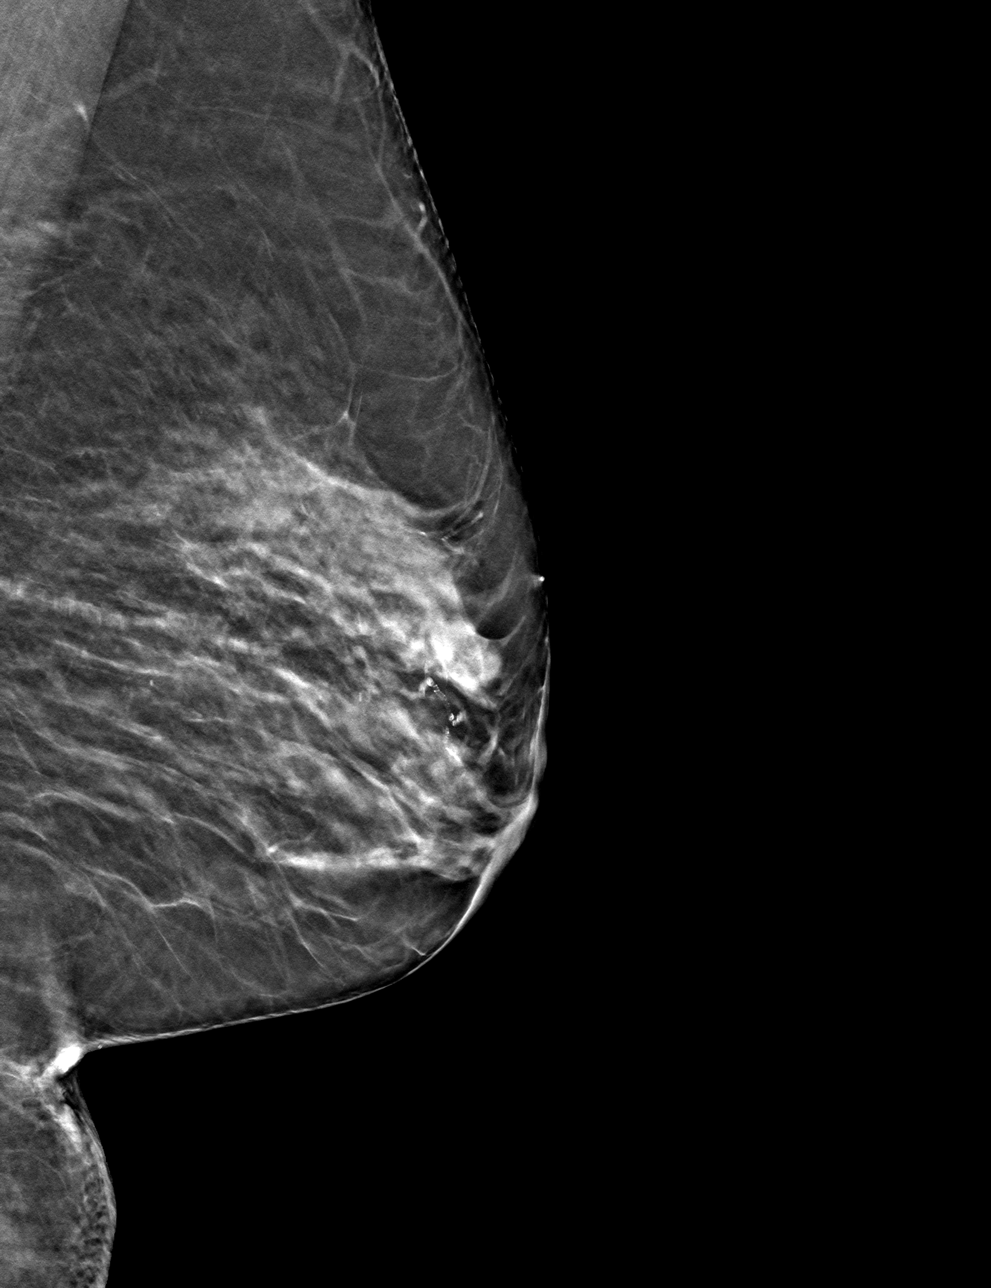

[4 of 8 positions shown; findings below may reference images not displayed]

ACR Breast Density Category c: The breast tissue is heterogeneously
dense, which may obscure small masses.
FINDINGS: On today's additional diagnostic views, including magnification
views, scattered benign-appearing punctate, round and vascular
calcifications are confirmed within the anterior LEFT breast. There
are no suspicious pleomorphic or fine linear branching
calcifications identified.
IMPRESSION: No evidence of malignancy. Benign-appearing calcifications within
the LEFT breast.

Patient may return to routine annual bilateral screening mammogram
schedule.

RECOMMENDATION:
Screening mammogram in one year.(Code:[id number])

I have discussed the findings and recommendations with the patient.
If applicable, a reminder letter will be sent to the patient
regarding the next appointment.

BI-RADS CATEGORY  2: Benign.

## 2022-09-19 ENCOUNTER — Ambulatory Visit: Payer: Medicare HMO | Admitting: Physical Therapy

## 2022-09-24 ENCOUNTER — Encounter: Payer: Self-pay | Admitting: Physical Therapy

## 2022-09-24 ENCOUNTER — Ambulatory Visit: Payer: Medicare HMO | Attending: Gastroenterology | Admitting: Physical Therapy

## 2022-09-24 DIAGNOSIS — R278 Other lack of coordination: Secondary | ICD-10-CM | POA: Diagnosis present

## 2022-09-24 DIAGNOSIS — M6281 Muscle weakness (generalized): Secondary | ICD-10-CM | POA: Insufficient documentation

## 2022-09-24 NOTE — Therapy (Signed)
OUTPATIENT PHYSICAL THERAPY FEMALE PELVIC TREATMENT   Patient Name: Tammie Phillips MRN: [id number] DOB:1946/11/12, 76 y.o., female Today's Date: 09/24/2022   PT End of Session - 09/24/22 0858     Visit Number 7    Number of Visits 12    Date for PT Re-Evaluation 09/12/22    Authorization Type IE 06/20/2022    PT Start Time 0900    PT Stop Time 0940    PT Time Calculation (min) 40 min    Activity Tolerance Patient tolerated treatment well    Behavior During Therapy [facility] for tasks assessed/performed             History reviewed. No pertinent past medical history. Past Surgical History:  Procedure Laterality Date   ABDOMINAL HYSTERECTOMY     partial   BREAST BIOPSY Right 11/25/2019   calcs, x marker,neg   BREAST BIOPSY Right 11/25/2019   calcs, ribbon marker, neg   LAPAROSCOPIC APPENDECTOMY     There are no problems to display for this patient.   PCP: Gayland Curry, MD  REFERRING PROVIDER: Ok Edwards, NP   REFERRING DIAG: [phone] (ICD-10-CM) - Irritable bowel syndrome with diarrhea M62.89 (ICD-10-CM) - Other specified disorders of muscle   THERAPY DIAG:  Other lack of coordination  Muscle weakness (generalized)  Rationale for Evaluation and Treatment Rehabilitation  PRECAUTIONS: None  WEIGHT BEARING RESTRICTIONS No  FALLS:  Has patient fallen in last 6 months? No  ONSET DATE: 2020-2021  SUBJECTIVE:                                                                                                                                                                                           Patient notes that her trip to [place] was not without incident, but that her symptoms were much better managed this year as compared to last year. Patient continues to deal FI but feels things are under better control. Now that she is getting back into her routine, she is hoping to add in more strengthening activities. Patient has consistently worked on PFM  proprioception with breathing exercises.    PERTINENT HISTORY/CHART REVIEW:  Red flags (bowel/bladder changes, saddle paresthesia, personal history of cancer, h/o spinal tumors, h/o compression fx, h/o abdominal aneurysm, abdominal pain, chills/fever, night sweats, nausea, vomiting, unrelenting pain, first onset of insidious LBP <20 y/o): Negative  Last GI note: "[name] was last seen on 04/29/22 for "diarrhea/llq pain; this is ongoing. Has had negative colon biopsy in past. 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 CT A/P. Daily psyllium for now, could consider some loperamide 27m qd -bid prn. Try the exercises, considering sending  back to pfpt. Follow up 6w sooner if problems." CT done as above. Fecal calprotectin and celiac unremarkable Since the last visit, was seen by gyn and had negative pap smear. Did note some atrophic vaginitis. States she is better than at last visit- states she is having less days with loose stools. Feels the psyllium has improved her stool consistency and frequency. Does still have some loose stools, ranges 1-4/d- the more she has the looser they get. There is sometimes still some urgency and fecal leakage but less than before. Denies any problems with abdominal pain/rectal bleeding, further gi concerns. Weight up a pound since last visit. She is not anemic. Colon cancer screening last 10/20 and had negative colon biopsies at that time. Has been treated for pelvic floor dysfunction with PT is past for same concerns, states is helped. Thinks she would like to do this again. We discussed her symptoms, lab/ct results, and their chronicity and the likelihood that she has IBSD and its management. Note she has had a few abdominal surgeries in past."    PAIN:  Are you having pain? No NPRS scale:  0/10 (current) Will have some LLQ pain when in the middle of a bowel flare up; defines this as soreness/fatigue.   OCCUPATION/LEISURE ACTIVITIES:  Golfing, hiking, tai chi    PLOF:  Independent  PATIENT GOALS: See if PFPT can offer any improvement  GASTROINTESTINAL HISTORY: Type of bowel movement: (Bristol Stool Scale) 4-7 Frequency of BMs: 1-5x/day Incomplete bowel movement: Yes  Pain with defecation: Positive for active hemorrhoids Straining with defecation: Negative Hemorrhoids: Positive for internal & external; active/latent Fiber supplement: Yes  Incontinence: Positive for flatus and stool.  Triggers: dairy products, unknown (no apparent stress component), presence of loose stools  Amount: Min- Complete Loss.   OBJECTIVE:  DIAGNOSTIC TESTING/IMAGING: 05/09/2022 CT: IMPRESSION:  1. No acute findings in the abdomen pelvis.  2. Sigmoid colon diverticulosis without evidence diverticulitis.  3.  Aortic Atherosclerosis (ICD10-I70.0).   EXTERNAL PELVIC EXAM:  Patient educated on the purpose of the procedure/exam and articulated understanding and consented to the procedure/exam. and verbal  Breath coordination: present and aware Voluntary Contraction: present, 1/5 with hip adductor and abdominal compensatory patterns; testing performed in supine hooklying and sidelying. Not able to coordinate multiple sequential repetitions.  Relaxation: full Perineal movement with sustained IAP increase ("bear down"): descent Perineal movement with rapid IAP increase ("cough"): no change  TREATMENT  Pre-treatment assessment:  Manual Therapy:   Neuromuscular Re-education: Reassessed goals; see below.    Therapeutic Exercise:   Treatments unbilled:  Post-treatment assessment:  Patient educated throughout session on appropriate technique and form using multi-modal cueing, HEP, and activity modification. Patient articulated understanding and returned demonstration.  Patient Response to interventions: Comfortable to return after f/u with GI    PATIENT SURVEYS:  FOTO Bowel 49  ASSESSMENT:  Clinical Impression: Patient presents to clinic with  excellent motivation to participate in therapy. Patient demonstrates minimal remaining deficits in PFM strength and coordination. Patient indicating goal achievement with respect to outcome measure and HEP during today's session and reporting greater confidence in self-management. Patient may benefit from continued skilled therapeutic intervention to address any recurring deficits in PFM strength and coordination in order to increase function and improve overall QOL.    Objective impairments: decreased coordination, decreased endurance, decreased strength, improper body mechanics, and postural dysfunction.   Activity limitations: meal prep, cleaning, laundry, driving, and community activity.   Personal factors: Age, Past/current experiences, Time since onset of injury/illness/exacerbation, and  3+ comorbidities: osteopenia, Bradycardia, essential HTN, history of recurrent UTIs, HLD, atherosclerosis of abdominal aorta  are also affecting patient's functional outcome.   Rehab Potential: Fair    Clinical decision making: Evolving/moderate complexity  Evaluation complexity: Moderate   GOALS: Goals reviewed with patient? Yes  LONG TERM GOALS: Target date: 09/12/2022  Patient will demonstrate improved function as evidenced by a score of 61 on FOTO measure for full participation in activities at home and in the community.  Baseline: 49; 8/31: 50; 10/3: 64 Goal status: MET  Patient will demonstrate independence with HEP in order to maximize therapeutic gains and improve carryover from physical therapy sessions to ADLs in the home and community. Baseline: not initiated; 8/31: breathing exercises IND, strengthening exercises 80%; 10/3: IND Goal status: MET  Patient will demonstrate circumferential and sequential contraction of >3/5 MMT, > 5 sec hold x5 and 5 consecutive quick flicks with </= 10 min rest between testing bouts, and relaxation of the PFM coordinated with breath for improved management  of intra-abdominal pressure and normal bowel and bladder function without the presence of pain nor incontinence in order to improve participation at home and in the community. Baseline: 1/5 x1 rep; 8/31: 1/5, x 3 reps;  Goal status: IN PROGRESS    PLAN: Rehab frequency: one time visit  Rehab duration:   Planned interventions: Therapeutic exercises, Therapeutic activity, Neuromuscular re-education, Balance training, Gait training, Patient/Family education, Joint mobilization, Orthotic/Fit training, Spinal mobilization, Cryotherapy, Moist heat, scar mobilization, Taping, and Manual therapy     Myles Gip PT, DPT [phone]  09/24/2022, 8:58 AM

## 2022-09-26 ENCOUNTER — Encounter: Payer: Medicare HMO | Admitting: Physical Therapy

## 2023-06-30 ENCOUNTER — Other Ambulatory Visit: Payer: Self-pay | Admitting: Family Medicine

## 2023-06-30 DIAGNOSIS — Z1231 Encounter for screening mammogram for malignant neoplasm of breast: Secondary | ICD-10-CM

## 2023-06-30 DIAGNOSIS — M85851 Other specified disorders of bone density and structure, right thigh: Secondary | ICD-10-CM

## 2023-07-28 ENCOUNTER — Ambulatory Visit
Admission: RE | Admit: 2023-07-28 | Discharge: 2023-07-28 | Disposition: A | Discharge status: Home / Self Care | Payer: Medicare HMO | Source: Ambulatory Visit | Attending: Family Medicine | Admitting: Family Medicine

## 2023-07-28 DIAGNOSIS — Z1231 Encounter for screening mammogram for malignant neoplasm of breast: Secondary | ICD-10-CM | POA: Insufficient documentation

## 2023-07-28 DIAGNOSIS — M85851 Other specified disorders of bone density and structure, right thigh: Secondary | ICD-10-CM | POA: Insufficient documentation

## 2023-09-18 ENCOUNTER — Ambulatory Visit: Payer: Medicare HMO

## 2023-09-18 DIAGNOSIS — K640 First degree hemorrhoids: Secondary | ICD-10-CM | POA: Diagnosis not present

## 2023-09-18 DIAGNOSIS — R198 Other specified symptoms and signs involving the digestive system and abdomen: Secondary | ICD-10-CM | POA: Diagnosis present

## 2023-09-18 DIAGNOSIS — R634 Abnormal weight loss: Secondary | ICD-10-CM | POA: Diagnosis not present

## 2023-09-18 DIAGNOSIS — D120 Benign neoplasm of cecum: Secondary | ICD-10-CM | POA: Diagnosis not present

## 2023-09-18 DIAGNOSIS — R197 Diarrhea, unspecified: Secondary | ICD-10-CM | POA: Diagnosis not present

## 2023-09-18 DIAGNOSIS — K5730 Diverticulosis of large intestine without perforation or abscess without bleeding: Secondary | ICD-10-CM | POA: Diagnosis not present

## 2024-07-06 ENCOUNTER — Other Ambulatory Visit: Payer: Self-pay | Admitting: Family Medicine

## 2024-07-06 DIAGNOSIS — N644 Mastodynia: Secondary | ICD-10-CM

## 2024-07-08 ENCOUNTER — Ambulatory Visit
Admission: RE | Admit: 2024-07-08 | Discharge: 2024-07-08 | Disposition: A | Discharge status: Home / Self Care | Source: Ambulatory Visit | Attending: Family Medicine | Admitting: Family Medicine

## 2024-07-08 ENCOUNTER — Inpatient Hospital Stay
Admission: RE | Admit: 2024-07-08 | Discharge: 2024-07-08 | Discharge status: Home / Self Care | Source: Ambulatory Visit | Attending: Family Medicine | Admitting: Family Medicine

## 2024-07-08 DIAGNOSIS — N644 Mastodynia: Secondary | ICD-10-CM

## 2024-11-03 ENCOUNTER — Ambulatory Visit: Admitting: Podiatry

## 2024-11-03 ENCOUNTER — Ambulatory Visit (INDEPENDENT_AMBULATORY_CARE_PROVIDER_SITE_OTHER)

## 2024-11-03 DIAGNOSIS — G5790 Unspecified mononeuropathy of unspecified lower limb: Secondary | ICD-10-CM

## 2024-11-03 DIAGNOSIS — M2041 Other hammer toe(s) (acquired), right foot: Secondary | ICD-10-CM

## 2024-11-03 DIAGNOSIS — G5793 Unspecified mononeuropathy of bilateral lower limbs: Secondary | ICD-10-CM | POA: Diagnosis not present

## 2024-11-03 DIAGNOSIS — M65971 Unspecified synovitis and tenosynovitis, right ankle and foot: Secondary | ICD-10-CM | POA: Diagnosis not present

## 2024-11-03 MED ORDER — TRIAMCINOLONE ACETONIDE 40 MG/ML IJ SUSP
20.0000 mg | Freq: Once | INTRAMUSCULAR | Status: AC
  Administered 2024-11-03: 20 mg

## 2024-11-03 NOTE — Progress Notes (Signed)
 Subjective:  Patient ID: Tammie Phillips, female    DOB: 07/13/1946,  MRN: [id number] HPI Chief Complaint  Patient presents with   Toe Pain    New pt- nerve pain toe moving up. Right foot shooting pain thru 4th toe. Pain in the ball of the right foot. No major  issue with the left foot.    78 y.o. female presents with the above complaint.   ROS: Denies fever chills nausea mobic muscle aches pains calf pain back pain chest pain shortness of breath.  No past medical history on file. Past Surgical History:  Procedure Laterality Date   ABDOMINAL HYSTERECTOMY     partial   BREAST BIOPSY Right 11/25/2019   calcs, x marker,neg   BREAST BIOPSY Right 11/25/2019   calcs, ribbon marker, neg   LAPAROSCOPIC APPENDECTOMY      Current Outpatient Medications:    amLODipine (NORVASC) 5 MG tablet, Take 5 mg by mouth daily., Disp: , Rfl:    aspirin EC 81 MG tablet, Take 81 mg by mouth daily. Swallow whole., Disp: , Rfl:    calcium carbonate (OSCAL) 1500 (600 Ca) MG TABS tablet, Take by mouth 2 (two) times daily with a meal., Disp: , Rfl:    calcium-vitamin D (OSCAL WITH D) 500-200 MG-UNIT tablet, Take 1 tablet by mouth., Disp: , Rfl:    hydrochlorothiazide (HYDRODIURIL) 25 MG tablet, Take 25 mg by mouth daily., Disp: , Rfl:    lisinopril (ZESTRIL) 2.5 MG tablet, Take 2.5 mg by mouth daily., Disp: , Rfl:    nitroGLYCERIN (NITROSTAT) 0.4 MG SL tablet, Place 0.4 mg under the tongue every 5 (five) minutes as needed for chest pain., Disp: , Rfl:    polycarbophil (FIBERCON) 625 MG tablet, Take 625 mg by mouth in the morning and at bedtime., Disp: , Rfl:    rosuvastatin (CRESTOR) 40 MG tablet, Take 40 mg by mouth daily., Disp: , Rfl:   Not on File Review of Systems Objective:  There were no vitals filed for this visit.  General: Well developed, nourished, in no acute distress, alert and oriented x3   Dermatological: Skin is warm, dry and supple bilateral. Nails x 10 are well maintained;  remaining integument appears unremarkable at this time. There are no open sores, no preulcerative lesions, no rash or signs of infection present.  Vascular: Dorsalis Pedis artery and Posterior Tibial artery pedal pulses are 2/4 bilateral with immedate capillary fill time. Pedal hair growth present. No varicosities and no lower extremity edema present bilateral.   Neruologic: Grossly intact via light touch bilateral. Vibratory intact via tuning fork bilateral. Protective threshold with Semmes Wienstein monofilament intact to all pedal sites bilateral. Patellar and Achilles deep tendon reflexes 2+ bilateral. No Babinski or clonus noted bilateral.  She does have some neuroma type symptoms on palpation to the third interdigital space of the right foot with radiating pain dorsally along the intermediate dorsal cutaneous nerve.  Musculoskeletal: No gross boney pedal deformities bilateral. No pain, crepitus, or limitation noted with foot and ankle range of motion bilateral. Muscular strength 5/5 in all groups tested bilateral.  She has pain on palpation and end range of motion of the second metatarsal phalangeal joint of the right foot.  There is mild edema mild erythema no cellulitis drainage or odor.  Gait: Unassisted, Nonantalgic.    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  No acute findings are identified only mild medial deviation of the second metatarsal phalangeal joints bilaterally.  Assessment & Plan:   Assessment: Synovitis/capsulitis possible early hammertoe deformity second metatarsal phalangeal joint right foot  Possible neuroma third interdigital space right foot.  Plan: Discussed etiology pathology conservative versus surgical therapies.  At this point I injected around the second  metatarsal phalangeal joint of the right foot with 10 mg Kenalog 5 mg of Marcaine.  Tolerated procedure well without complications.     Tammie Phillips T. [place], [place]

## 2025-01-25 ENCOUNTER — Other Ambulatory Visit: Payer: Self-pay | Admitting: Orthopedic Surgery

## 2025-01-31 ENCOUNTER — Ambulatory Visit: Admitting: Podiatry
